# Patient Record
Sex: Female | Born: 1985 | Race: White | Hispanic: No | Smoking: Never smoker
Health system: Southern US, Community
[De-identification: ages and names within clinical notes are randomized; demographics above are authoritative.]

## PROBLEM LIST (undated history)

## (undated) DIAGNOSIS — K219 Gastro-esophageal reflux disease without esophagitis: Secondary | ICD-10-CM

## (undated) DIAGNOSIS — T7840XA Allergy, unspecified, initial encounter: Secondary | ICD-10-CM

## (undated) DIAGNOSIS — F419 Anxiety disorder, unspecified: Secondary | ICD-10-CM

## (undated) DIAGNOSIS — F32A Depression, unspecified: Secondary | ICD-10-CM

## (undated) DIAGNOSIS — K529 Noninfective gastroenteritis and colitis, unspecified: Secondary | ICD-10-CM

## (undated) DIAGNOSIS — G8929 Other chronic pain: Secondary | ICD-10-CM

## (undated) DIAGNOSIS — R87629 Unspecified abnormal cytological findings in specimens from vagina: Secondary | ICD-10-CM

## (undated) DIAGNOSIS — R519 Headache, unspecified: Secondary | ICD-10-CM

## (undated) DIAGNOSIS — M1990 Unspecified osteoarthritis, unspecified site: Secondary | ICD-10-CM

## (undated) DIAGNOSIS — K900 Celiac disease: Secondary | ICD-10-CM

## (undated) DIAGNOSIS — R51 Headache: Secondary | ICD-10-CM

## (undated) DIAGNOSIS — D649 Anemia, unspecified: Secondary | ICD-10-CM

## (undated) DIAGNOSIS — J45909 Unspecified asthma, uncomplicated: Secondary | ICD-10-CM

## (undated) HISTORY — DX: Celiac disease: K90.0

## (undated) HISTORY — DX: Headache, unspecified: R51.9

## (undated) HISTORY — DX: Unspecified abnormal cytological findings in specimens from vagina: R87.629

## (undated) HISTORY — DX: Unspecified osteoarthritis, unspecified site: M19.90

## (undated) HISTORY — DX: Other chronic pain: G89.29

## (undated) HISTORY — DX: Anemia, unspecified: D64.9

## (undated) HISTORY — DX: Anxiety disorder, unspecified: F41.9

## (undated) HISTORY — DX: Depression, unspecified: F32.A

## (undated) HISTORY — DX: Unspecified asthma, uncomplicated: J45.909

## (undated) HISTORY — DX: Gastro-esophageal reflux disease without esophagitis: K21.9

## (undated) HISTORY — DX: Allergy, unspecified, initial encounter: T78.40XA

## (undated) HISTORY — DX: Headache: R51

## (undated) HISTORY — DX: Noninfective gastroenteritis and colitis, unspecified: K52.9

## (undated) HISTORY — PX: ADENOIDECTOMY: SUR15

---
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
## 2003-04-06 ENCOUNTER — Encounter: Payer: Self-pay | Admitting: Family Medicine

## 2003-04-06 LAB — CONVERTED CEMR LAB

## 2004-02-19 ENCOUNTER — Ambulatory Visit: Payer: Self-pay | Admitting: Family Medicine

## 2004-04-09 ENCOUNTER — Ambulatory Visit: Payer: Self-pay | Admitting: Family Medicine

## 2004-04-13 ENCOUNTER — Ambulatory Visit: Payer: Self-pay | Admitting: Internal Medicine

## 2004-05-29 ENCOUNTER — Ambulatory Visit: Payer: Self-pay | Admitting: Family Medicine

## 2004-12-02 ENCOUNTER — Ambulatory Visit: Payer: Self-pay | Admitting: Family Medicine

## 2005-04-02 ENCOUNTER — Ambulatory Visit: Payer: Self-pay | Admitting: Family Medicine

## 2005-09-22 ENCOUNTER — Ambulatory Visit: Payer: Self-pay | Admitting: Family Medicine

## 2005-10-20 ENCOUNTER — Ambulatory Visit: Payer: Self-pay | Admitting: Gastroenterology

## 2005-11-29 ENCOUNTER — Ambulatory Visit: Payer: Self-pay | Admitting: Gastroenterology

## 2006-01-20 ENCOUNTER — Ambulatory Visit: Payer: Self-pay | Admitting: Family Medicine

## 2006-03-04 ENCOUNTER — Ambulatory Visit: Payer: Self-pay | Admitting: Family Medicine

## 2006-07-20 ENCOUNTER — Encounter: Payer: Self-pay | Admitting: Family Medicine

## 2006-07-20 DIAGNOSIS — K5900 Constipation, unspecified: Secondary | ICD-10-CM | POA: Insufficient documentation

## 2006-07-20 DIAGNOSIS — G4700 Insomnia, unspecified: Secondary | ICD-10-CM | POA: Insufficient documentation

## 2006-07-20 DIAGNOSIS — Z8619 Personal history of other infectious and parasitic diseases: Secondary | ICD-10-CM | POA: Insufficient documentation

## 2006-07-20 DIAGNOSIS — G43909 Migraine, unspecified, not intractable, without status migrainosus: Secondary | ICD-10-CM | POA: Insufficient documentation

## 2010-04-26 ENCOUNTER — Encounter: Payer: Self-pay | Admitting: Gastroenterology

## 2010-08-22 ENCOUNTER — Encounter: Payer: Self-pay | Admitting: Family Medicine

## 2010-08-25 ENCOUNTER — Ambulatory Visit: Payer: Self-pay | Admitting: Family Medicine

## 2010-08-25 DIAGNOSIS — Z0289 Encounter for other administrative examinations: Secondary | ICD-10-CM

## 2013-04-18 ENCOUNTER — Other Ambulatory Visit (HOSPITAL_COMMUNITY)
Admission: RE | Admit: 2013-04-18 | Discharge: 2013-04-18 | Disposition: A | Discharge status: Home / Self Care | Payer: BC Managed Care – PPO | Source: Ambulatory Visit | Attending: Family Medicine | Admitting: Family Medicine

## 2013-04-18 ENCOUNTER — Ambulatory Visit (INDEPENDENT_AMBULATORY_CARE_PROVIDER_SITE_OTHER): Payer: BC Managed Care – PPO | Admitting: Family Medicine

## 2013-04-18 ENCOUNTER — Encounter: Payer: Self-pay | Admitting: Family Medicine

## 2013-04-18 VITALS — BP 122/82 | HR 77 | Temp 98.6°F | Ht 67.0 in | Wt 153.5 lb

## 2013-04-18 DIAGNOSIS — R87810 Cervical high risk human papillomavirus (HPV) DNA test positive: Secondary | ICD-10-CM | POA: Insufficient documentation

## 2013-04-18 DIAGNOSIS — K900 Celiac disease: Secondary | ICD-10-CM

## 2013-04-18 DIAGNOSIS — Z124 Encounter for screening for malignant neoplasm of cervix: Secondary | ICD-10-CM | POA: Insufficient documentation

## 2013-04-18 DIAGNOSIS — Z0000 Encounter for general adult medical examination without abnormal findings: Secondary | ICD-10-CM

## 2013-04-18 DIAGNOSIS — Z1151 Encounter for screening for human papillomavirus (HPV): Secondary | ICD-10-CM | POA: Insufficient documentation

## 2013-04-18 DIAGNOSIS — Z01419 Encounter for gynecological examination (general) (routine) without abnormal findings: Secondary | ICD-10-CM

## 2013-04-18 MED ORDER — NORGESTIMATE-ETH ESTRADIOL 0.25-35 MG-MCG PO TABS: 1.0000 | ORAL_TABLET | Freq: Every day | ORAL | Status: DC

## 2013-04-18 MED ORDER — CYCLOBENZAPRINE HCL 10 MG PO TABS: 10.0000 mg | ORAL_TABLET | Freq: Three times a day (TID) | ORAL | Status: DC | PRN

## 2013-04-18 MED ORDER — PANTOPRAZOLE SODIUM 40 MG PO TBEC: 40.0000 mg | DELAYED_RELEASE_TABLET | Freq: Every day | ORAL | Status: DC

## 2013-04-18 MED ORDER — ALPRAZOLAM 0.25 MG PO TABS: 0.2500 mg | ORAL_TABLET | Freq: Two times a day (BID) | ORAL | Status: DC | PRN

## 2013-04-18 NOTE — Progress Notes (Signed)
Pre-visit discussion using our clinic review tool. No additional management support is needed unless otherwise documented below in the visit note.  

## 2013-04-18 NOTE — Progress Notes (Signed)
Subjective:    Patient ID: Brittany Copeland, female    DOB: 1986/01/10, 28 y.o.   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  Not a smoker   Not currently sexually active  Has had HPV in the past - with colp and bx -ok  Last paps are normal    Patient Active Problem List   Diagnosis Date Noted  . Routine general medical examination at a health care facility 04/18/2013  . Encounter for routine gynecological examination 04/18/2013  . 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 MIGRAINE HEADACHE 07/20/2006  . CONSTIPATION 07/20/2006  . INSOMNIA 07/20/2006   Past Medical History  Diagnosis Date  . Celiac disease    No past surgical history on file. History  Substance Use Topics  . Smoking status: Never Smoker   . Smokeless tobacco: Not on file  . Alcohol Use: Yes     Comment: socially   Family History  Problem Relation Age of Onset  . Heart disease Father    Allergies  Allergen Reactions  . Gluten Meal     Has celiac disease  .  Pineapple    No current outpatient prescriptions on file prior to visit.   No current facility-administered medications on file prior to visit.    Review of Systems Review of Systems  Constitutional: Negative for fever, appetite change, fatigue and unexpected weight change.  Eyes: Negative for pain and visual disturbance.  Respiratory: Negative for cough and shortness of breath.   Cardiovascular: Negative for cp or palpitations    Gastrointestinal: Negative for nausea, diarrhea and constipation.  Genitourinary: Negative for urgency and frequency.  Skin: Negative for pallor or rash   Neurological: Negative for weakness, light-headedness, numbness and headaches.  Hematological: Negative for adenopathy. Does not bruise/bleed easily.  Psychiatric/Behavioral: Negative for dysphoric mood. The patient is not nervous/anxious.         Objective:   Physical Exam  Constitutional: She appears well-developed and well-nourished. No distress.  HENT:  Head: Normocephalic and atraumatic.  Right Ear: External ear normal.  Left Ear: External ear normal.  Nose: Nose normal.  Mouth/Throat: Oropharynx is clear and moist.  Eyes: Conjunctivae and EOM are normal. Pupils are equal, round, and reactive to light. Right eye exhibits  no discharge. Left eye exhibits no discharge. No scleral icterus.  Neck: Normal range of motion. Neck supple. No JVD present. No thyromegaly present.  Cardiovascular: Normal rate, regular rhythm, normal heart sounds and intact distal pulses.  Exam reveals no gallop.   Pulmonary/Chest: Effort normal and breath sounds normal. No respiratory distress. She has no wheezes. She has no rales.  Abdominal: Soft. Bowel sounds are normal. She exhibits no distension and no mass. There is no tenderness.  Genitourinary: No breast swelling, tenderness, discharge or bleeding. There is no rash, tenderness or lesion on the right labia. There is no rash, tenderness or lesion on the left labia. Uterus  is not enlarged and not tender. Cervix exhibits no motion tenderness, no discharge and no friability. Right adnexum displays no mass, no tenderness and no fullness. Left adnexum displays no mass, no tenderness and no fullness. No erythema or tenderness around the vagina.  Scant spotting/ end of menses  Breast exam: No mass, nodules, thickening, tenderness, bulging, retraction, inflamation, nipple discharge or skin changes noted.  No axillary or clavicular LA.  Chaperoned exam.    Musculoskeletal: She exhibits no edema and no tenderness.  Lymphadenopathy:    She has no cervical adenopathy.  Neurological: She is alert. She has normal reflexes. No cranial nerve deficit. She exhibits normal muscle tone. Coordination normal.  Skin: Skin is warm and dry. No rash noted. No erythema. No pallor.  Psychiatric: She has a normal mood and affect.          Assessment & Plan:

## 2013-04-18 NOTE — Patient Instructions (Addendum)
Labs today  Start the ortho cyclen on Sunday and take as directed  Take care of yourself  I sent medicines to [facility] pharmacy next door

## 2013-04-19 DIAGNOSIS — K900 Celiac disease: Secondary | ICD-10-CM | POA: Insufficient documentation

## 2013-04-19 LAB — LIPID PANEL
CHOLESTEROL: 166 mg/dL (ref 0–200)
HDL: 71 mg/dL (ref >39.00)
LDL Cholesterol: 69 mg/dL (ref 0–99)
TRIGLYCERIDES: 130 mg/dL (ref 0.0–149.0)
Total CHOL/HDL Ratio: 2
VLDL: 26 mg/dL (ref 0.0–40.0)

## 2013-04-19 LAB — CBC WITH DIFFERENTIAL/PLATELET
BASOS PCT: 0.5 % (ref 0.0–3.0)
Basophils Absolute: 0 10*3/uL (ref 0.0–0.1)
EOS ABS: 0 10*3/uL (ref 0.0–0.7)
EOS PCT: 0.6 % (ref 0.0–5.0)
HEMATOCRIT: 37.2 % (ref 36.0–46.0)
Hemoglobin: 12.7 g/dL (ref 12.0–15.0)
LYMPHS ABS: 2.4 10*3/uL (ref 0.7–4.0)
Lymphocytes Relative: 33.2 % (ref 12.0–46.0)
MCHC: 34.2 g/dL (ref 30.0–36.0)
MCV: 87.6 fl (ref 78.0–100.0)
MONO ABS: 0.3 10*3/uL (ref 0.1–1.0)
Monocytes Relative: 4.2 % (ref 3.0–12.0)
Neutro Abs: 4.3 10*3/uL (ref 1.4–7.7)
Neutrophils Relative %: 61.5 % (ref 43.0–77.0)
PLATELETS: 295 10*3/uL (ref 150.0–400.0)
RBC: 4.25 Mil/uL (ref 3.87–5.11)
RDW: 12.6 % (ref 11.5–14.6)
WBC: 7.1 10*3/uL (ref 4.5–10.5)

## 2013-04-19 LAB — COMPREHENSIVE METABOLIC PANEL
ALK PHOS: 43 U/L (ref 39–117)
ALT: 19 U/L (ref 0–35)
AST: 19 U/L (ref 0–37)
Albumin: 4.5 g/dL (ref 3.5–5.2)
BILIRUBIN TOTAL: 0.4 mg/dL (ref 0.3–1.2)
BUN: 12 mg/dL (ref 6–23)
CO2: 27 mEq/L (ref 19–32)
Calcium: 9.8 mg/dL (ref 8.4–10.5)
Chloride: 103 mEq/L (ref 96–112)
Creatinine, Ser: 0.6 mg/dL (ref 0.4–1.2)
GFR: 127.66 mL/min (ref >60.00)
GLUCOSE: 94 mg/dL (ref 70–99)
Potassium: 4.1 mEq/L (ref 3.5–5.1)
Sodium: 137 mEq/L (ref 135–145)
Total Protein: 7.5 g/dL (ref 6.0–8.3)

## 2013-04-19 LAB — TSH: TSH: 0.38 u[IU]/mL (ref 0.35–5.50)

## 2013-04-19 NOTE — Assessment & Plan Note (Signed)
Annual exam with pap  Remote hx of HPV Pt declines std testing  OC- ortho cyclen to start Long discussion re: way to take OC properly and avoidance of smoking  Risks of blood clots outlined as well as possible side eff Pt aware that this does not prevent stds and condoms should still be used inst that it may take up to 3 months for menses to fall into rhythm or side eff to stop  Adv to call if problems or questions

## 2013-04-19 NOTE — Assessment & Plan Note (Signed)
Reviewed health habits including diet and exercise and skin cancer prevention Reviewed appropriate screening tests for age  Also reviewed health mt list, fam hx and immunization status , as well as social and family history   See HPI Wellness lab today

## 2013-04-24 ENCOUNTER — Telehealth: Payer: Self-pay | Admitting: *Deleted

## 2013-04-24 NOTE — Telephone Encounter (Signed)
aware

## 2013-04-24 NOTE — Telephone Encounter (Signed)
Brittany Copeland with [facility] called in a call report, pt has abnormal pap smear (pos HPV), [facility] faxed over copy of results because it may be a day or so before the final results are in [facility], received fax and placed in your inbox

## 2013-04-25 ENCOUNTER — Telehealth: Payer: Self-pay | Admitting: Family Medicine

## 2013-04-25 DIAGNOSIS — IMO0002 Reserved for concepts with insufficient information to code with codable children: Secondary | ICD-10-CM | POA: Insufficient documentation

## 2013-04-25 NOTE — Telephone Encounter (Signed)
Abn pap with hpv Ref to gyn

## 2013-05-01 ENCOUNTER — Encounter: Payer: BC Managed Care – PPO | Admitting: Obstetrics and Gynecology

## 2013-05-08 ENCOUNTER — Ambulatory Visit (INDEPENDENT_AMBULATORY_CARE_PROVIDER_SITE_OTHER): Payer: BC Managed Care – PPO | Admitting: Family Medicine

## 2013-05-08 ENCOUNTER — Encounter: Payer: Self-pay | Admitting: Family Medicine

## 2013-05-08 VITALS — BP 122/78 | HR 84 | Temp 98.9°F | Wt 156.5 lb

## 2013-05-08 DIAGNOSIS — J0100 Acute maxillary sinusitis, unspecified: Secondary | ICD-10-CM

## 2013-05-08 MED ORDER — AMOXICILLIN-POT CLAVULANATE 875-125 MG PO TABS
1.0000 | ORAL_TABLET | Freq: Two times a day (BID) | ORAL | Status: DC
Start: 2013-05-08 — End: 2013-05-11

## 2013-05-08 NOTE — Assessment & Plan Note (Signed)
Anticipate viral given short duration, however provided with WASP for augmentin to fill in case not improving as expected or any worsening prior. See pt instructions for supportive care in interim. Pt agrees with plan.

## 2013-05-08 NOTE — Progress Notes (Signed)
Pre-visit discussion using our clinic review tool. No additional management support is needed unless otherwise documented below in the visit note.  

## 2013-05-08 NOTE — Progress Notes (Signed)
   Subjective:    Patient ID: Brittany Copeland, female    DOB: [birth year], 28 y.o.   MRN: [id number]  HPI CC: LAD?  Pleasant 28 yo with h/o celiac disease presents with 4-5 day history of R ear pain and neck glands swollen, PNdrainage.  Now with sneezing, mild dry cough, right maxillary facial pain/pressure and frontal pain as well.  Chills this morning.  Possible fever to 101 this morning (?digital thermometer).  So far has tried gluten free cough syrup by nature's way.  No abd pain, tooth pain, rashes.  No dyspnea or wheezing.  No sick contacts at home. Did get flu shot this year. No smokers at home. No h/o asthma.  Past Medical History  Diagnosis Date  . Celiac disease      Review of Systems Per HPi    Objective:   Physical Exam  Nursing note and vitals reviewed. Constitutional: She appears well-developed and well-nourished. No distress.  HENT:  Head: Normocephalic and atraumatic.  Right Ear: Hearing, tympanic membrane, external ear and ear canal normal.  Left Ear: Hearing, tympanic membrane, external ear and ear canal normal.  Nose: Mucosal edema present. No rhinorrhea. Right sinus exhibits maxillary sinus tenderness. Right sinus exhibits no frontal sinus tenderness. Left sinus exhibits no maxillary sinus tenderness and no frontal sinus tenderness.  Mouth/Throat: Uvula is midline, oropharynx is clear and moist and mucous membranes are normal. No oropharyngeal exudate, posterior oropharyngeal edema, posterior oropharyngeal erythema or tonsillar abscesses.  Nasal mucosal inflammation/erythema  Eyes: Conjunctivae and EOM are normal. Pupils are equal, round, and reactive to light. No scleral icterus.  Neck: Normal range of motion. Neck supple. No thyromegaly present.  Cardiovascular: Normal rate, regular rhythm, normal heart sounds and intact distal pulses.   No murmur heard. Pulmonary/Chest: Effort normal and breath sounds normal. No respiratory distress. She has no  wheezes. She has no rales.  Lymphadenopathy:    She has cervical adenopathy (tender R AC LAD).  Skin: Skin is warm and dry. No rash noted.       Assessment & Plan:

## 2013-05-08 NOTE — Patient Instructions (Signed)
You have an acute sinus infection. Start ibuprofen 400-600mg  twice daily with food. Push fluids and plenty of rest. Check with pharmacist about plain mucinex use with celiac disease - with plenty of water to help mobilize mucous. Nasal saline irrigation or neti pot to help drain sinuses. If persistent symptoms or if any worsening fill antibiotic (augmentin 10 d course).

## 2013-05-11 ENCOUNTER — Telehealth: Payer: Self-pay

## 2013-05-11 MED ORDER — DOXYCYCLINE HYCLATE 100 MG PO CAPS: 100.0000 mg | ORAL_CAPSULE | Freq: Two times a day (BID) | ORAL | Status: DC

## 2013-05-11 NOTE — Telephone Encounter (Signed)
Patient notified

## 2013-05-11 NOTE — Telephone Encounter (Signed)
Let's stop augmentin, may try doxycycline for sinus infection.  Sent in. Remember to use second form of birth control while on antibiotics

## 2013-05-11 NOTE — Telephone Encounter (Signed)
Pt left v/m; pt was seen 05/08/13 and pt got augmentin filled on 05/10/13 and began to take on 05/10/13, pt started to vomit after taking second dose of Augmentin, pt has vomited x 3 since last night; last time pt vomited was 3AM. Pt took Augmentin with food but still had vomiting. Pt has not eaten since last night and pt is able to drink fluids this AM. Pt wants to know if should stop Augmentin and have different antibiotic to [facility]. Pt request cb.

## 2013-06-06 ENCOUNTER — Encounter: Payer: BC Managed Care – PPO | Admitting: Family Medicine

## 2013-07-05 ENCOUNTER — Encounter: Payer: BC Managed Care – PPO | Admitting: Obstetrics & Gynecology

## 2013-09-11 ENCOUNTER — Encounter: Payer: Self-pay | Admitting: Family Medicine

## 2013-09-11 ENCOUNTER — Ambulatory Visit (INDEPENDENT_AMBULATORY_CARE_PROVIDER_SITE_OTHER): Payer: BC Managed Care – PPO | Admitting: Family Medicine

## 2013-09-11 ENCOUNTER — Ambulatory Visit (INDEPENDENT_AMBULATORY_CARE_PROVIDER_SITE_OTHER)
Admission: RE | Admit: 2013-09-11 | Discharge: 2013-09-11 | Disposition: A | Discharge status: Home / Self Care | Payer: BC Managed Care – PPO | Source: Ambulatory Visit | Attending: Family Medicine | Admitting: Family Medicine

## 2013-09-11 VITALS — BP 112/70 | HR 80 | Temp 98.5°F | Ht 67.0 in | Wt 150.8 lb

## 2013-09-11 DIAGNOSIS — M2240 Chondromalacia patellae, unspecified knee: Secondary | ICD-10-CM

## 2013-09-11 DIAGNOSIS — M25561 Pain in right knee: Secondary | ICD-10-CM

## 2013-09-11 DIAGNOSIS — M94261 Chondromalacia, right knee: Secondary | ICD-10-CM

## 2013-09-11 DIAGNOSIS — M25569 Pain in unspecified knee: Secondary | ICD-10-CM

## 2013-09-11 NOTE — Progress Notes (Signed)
Pre visit review using our clinic review tool, if applicable. No additional management support is needed unless otherwise documented below in the visit note. 

## 2013-09-11 NOTE — Progress Notes (Signed)
Subjective:    Patient ID: Brittany Copeland, female    DOB: [birth year], 28 y.o.   MRN: [id number]  HPI Here with R knee pain  It started hurt when she was working out -- she does Archivist and squats routine  3 weeks   Pain seems under patella and medial to it  At times is very painful (sunday night she could not get to sleep) at other times just a dull ache Worse with squatting and stairs  Lots of grinding and popping (both knees)   No hx of injury   It was a little swollen the other day   Patient Active Problem List   Diagnosis Date Noted  . Acute maxillary sinusitis 05/08/2013  . Abnormal cervical Pap smear with positive HPV DNA test 04/25/2013  . Celiac disease 04/19/2013  . Routine general medical examination at a health care facility 04/18/2013  . Encounter for routine gynecological examination 04/18/2013  . 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 MIGRAINE HEADACHE 07/20/2006   Past Medical History  Diagnosis Date  . Celiac disease    No past surgical history on file. History  Substance Use Topics  . Smoking status: Never Smoker   . Smokeless tobacco: Not on file  . Alcohol Use: Yes     Comment: socially   Family History  Problem Relation Age of Onset  . Heart disease Father    Allergies  Allergen Reactions  . Augmentin [Amoxicillin-Pot Clavulanate] Nausea And Vomiting  . Gluten Meal     Has celiac disease  . Pineapple    Current Outpatient Prescriptions on File Prior to Visit  Medication Sig Dispense Refill  . ALPRAZolam (XANAX) 0.25 MG tablet Take 1 tablet (0.25 mg total) by mouth 2 (two) times daily as needed for anxiety.  30 tablet  1  . cyclobenzaprine (FLEXERIL) 10 MG tablet Take 1 tablet (10 mg total) by mouth 3 (three) times daily as needed (migraines).  30 tablet  1  . pantoprazole (PROTONIX) 40 MG tablet Take 1 tablet (40 mg total) by mouth daily.  30 tablet  11   No current facility-administered medications on file prior to visit.      Review of Systems Review of Systems  Constitutional: Negative for fever, appetite change, fatigue and unexpected weight change.  Eyes: Negative for pain and visual disturbance.  Respiratory: Negative for cough and shortness of breath.   Cardiovascular: Negative for cp or palpitations    Gastrointestinal: Negative for nausea, diarrhea and constipation.  Genitourinary: Negative for urgency and frequency.  Skin: Negative for pallor or rash   MSK pos for knee pain / neg for other joint pain or changes  Neurological: Negative for weakness, light-headedness, numbness and headaches.  Hematological: Negative for adenopathy. Does not bruise/bleed easily.  Psychiatric/Behavioral: Negative for dysphoric mood. The patient is not nervous/anxious.         Objective:   Physical Exam  Constitutional: She appears well-developed and well-nourished. No distress.  Cardiovascular: Normal rate and regular rhythm.   Musculoskeletal: She exhibits tenderness. She exhibits no edema.       Right knee: She exhibits decreased range of motion. She exhibits no swelling, no effusion, no erythema, normal alignment, no LCL laxity, no bony tenderness, normal meniscus and no MCL laxity. Tenderness found. Medial joint line and patellar tendon tenderness noted.  R knee: Mild crepitus  Pain to flex over 90 deg/nl ext /neg bounce test Stable on lachman and ant drawer  Neg mcmurray Some patellar  grind  Tender over patellar tendon and medial patellar border  Neurological: She is alert. She has normal reflexes. No cranial nerve deficit. She exhibits normal muscle tone. Coordination normal.  Skin: Skin is warm and dry. No rash noted. No erythema.  Psychiatric: She has a normal mood and affect.          Assessment & Plan:   Problem List Items Addressed This Visit     Musculoskeletal and Integument   Chondromalacia of right knee     Suspect this is source of pain  Xray knee today Ibuprofen tid prn with  food/elevate/ice/relative rest this week  May need sport med/strengthening exercises Adv a patellar sleeve or knee band for activity Update further after rad rev of xr    Relevant Orders      DG Knee Complete 4 Views Right (Completed)     Other   Right knee pain - Primary   Relevant Orders      DG Knee Complete 4 Views Right (Completed)

## 2013-09-11 NOTE — Assessment & Plan Note (Signed)
Suspect this is source of pain  Xray knee today Ibuprofen tid prn with food/elevate/ice/relative rest this week  May need sport med/strengthening exercises Adv a patellar sleeve or knee band for activity Update further after rad rev of xr

## 2013-09-11 NOTE — Patient Instructions (Signed)
I think you have a patellar tracking problem in the right knee  Rest it this week Elevate and ice it whenever you can  Get a pull on knee sleeve with patellar hole or a knee band that fits under the patella and wear for activity Ibuprofen 400-800 mg with a meal up to three times daily is ok  Xray now and then we will make a plan

## 2013-09-14 ENCOUNTER — Telehealth: Payer: Self-pay

## 2013-09-14 NOTE — Telephone Encounter (Signed)
Pt left v/m requesting knee xray report called to [phone].

## 2013-09-14 NOTE — Telephone Encounter (Signed)
Results released on Mychart, I advise pt over phone of Dr. Marliss Coots comments/ recommendations

## 2013-10-19 ENCOUNTER — Telehealth: Payer: Self-pay | Admitting: Family Medicine

## 2013-10-19 MED ORDER — BACITRA-NEOMYCIN-POLYMYXIN-HC 1 % OP OINT: TOPICAL_OINTMENT | OPHTHALMIC | Status: DC

## 2013-10-19 NOTE — Telephone Encounter (Signed)
I looked at the eye drops -and since it is a homeopathic product I cannot get a good handle on what the gluten content is - there are several herbal ingredients as well as sulfur  The best thing for a stye is warm compresses as often as possible and keeping it clean If it is very large/ draining -it is best to see a doctor (or if fever etc) (although drainage may also help it go down) Otherwise we may try  an opthalmologic ointment - we usually use it in the eye (so it is safe it it gets in the eye)- but try to put it on the stye area twice daily  If worse -please go to Urgent care over the weekend for immediate attention-especially if more redness/swelling or any fever or vision change   Please call in px

## 2013-10-19 NOTE — Telephone Encounter (Signed)
Pt notified of Dr. Marliss Coots comments/recommendations. Pt did want to try ointment. Rx sent to pharmacy and I advised pt if sxs worsen or don't improve to go to UC or f/u with our office next week, pt verbalized understanding

## 2013-10-19 NOTE — Telephone Encounter (Signed)
Patient Information:  Caller Name: Kiva  Phone: [phone]  Patient: Brittany Copeland, Brittany Copeland  Gender: Female  DOB: 1985/05/27  Age: 28 Years  PCP: Loura Pardon [facility])  Pregnant: No  Office Follow Up:  Does the office need to follow up with this patient?: Yes  Instructions For The Office: Patient requests Rx to treat her sty on right upper eyelid.   Symptoms  Reason For Call & Symptoms: Patient calling about sty on upper right eye lid.  She also reports she feels that she has had celiac reaction to Similasan eye drops.  She reports white thick / gooey drainage from a small spot near the outer area of the lid.  Reports moderate eye pain that interferes with activity.  Go to Office Now per Eye - Pus or Discharge guideline due to Moderate eye pain.  Reviewed Health History In EMR: Yes  Reviewed Medications In EMR: Yes  Reviewed Allergies In EMR: Yes  Reviewed Surgeries / Procedures: Yes  Date of Onset of Symptoms: 10/12/2013  Treatments Tried: warm compresses, eye drops  Treatments Tried Worked: No OB / GYN:  LMP: 10/15/2013  Guideline(s) Used:  Eye - Pus or Discharge  Disposition Per Guideline:   Go to Office Now  Reason For Disposition Reached:   Moderate eye pain (e.g., interferes with normal activities)  Advice Given:  Eyelid Cleansing:   Gently wash eyelids and lashes with warm water and wet cotton balls (or cotton gauze). Remove all the dried and liquid pus.  Do this as often as needed.  Eyelid Cleansing:   Gently wash eyelids and lashes with warm water and wet cotton balls (or cotton gauze). Remove all the dried and liquid pus.  Do this as often as needed.  Contact Lenses:  Switch to glasses for a short time. This will help stop damage to your eye.  Contagiousness:  Try not to touch your eyes. Wash your hands often. Do not share towels. Try not to touch your eyes. Wash your hands frequently. Do not share towels.  You may return to work or school. Avoid  physical contact (e.g., shaking hands) until the symptoms have resolved.  Call Back If  Eyelid becomes red or swollen  You become worse.  Patient Refused Recommendation:  Patient Refused Care Advice  Patient reports she is unsure if she could make it to office due to diarrhea and vomiting from what she feels is reaction to medication.  She asks for Rx to treat the sty.  Confirmed requested pharmacy.

## 2013-10-19 NOTE — Telephone Encounter (Signed)
See note

## 2014-01-18 ENCOUNTER — Other Ambulatory Visit: Payer: Self-pay

## 2014-02-06 ENCOUNTER — Ambulatory Visit (INDEPENDENT_AMBULATORY_CARE_PROVIDER_SITE_OTHER): Payer: BC Managed Care – PPO | Admitting: Family Medicine

## 2014-02-06 ENCOUNTER — Encounter: Payer: Self-pay | Admitting: Family Medicine

## 2014-02-06 VITALS — BP 120/70 | HR 75 | Temp 98.6°F | Ht 67.0 in | Wt 156.8 lb

## 2014-02-06 DIAGNOSIS — M25561 Pain in right knee: Secondary | ICD-10-CM

## 2014-02-06 NOTE — Progress Notes (Signed)
Dr. Frederico Hamman T. Lurie Mullane, MD, [facility] [place], [address] Phone: [phone] Fax: [phone]  02/06/2014  Patient: Brittany Copeland, MRN: [id number], DOB: 1985/12/06, 28 y.o.  Primary Physician:  Loura Pardon, MD  Chief Complaint: Knee Pain  Subjective:   Brittany Copeland is a 28 y.o. very pleasant female patient who presents with the following:  consulting physician:Dr. Glori Bickers Reason for consult: Ongoing right knee pain  Very pleasant young woman with celiac disease who presents with right-sided knee pain that has been ongoing since approximately May or June 2015.  She does not recall any specific occult injury.  She has been having some pain intermittently in the back, and she also does have some pain medially.  She has variously tried different anti-inflammatories, and she is altered her workout routine.  Most recently, she has been doing some Barre classes, and these usually feel okay, but occasionally she will have some pain medially and anteriorly.  She has not been having any significant swelling at all recently.  She has not had any locking up of the joint and no symptomatic giving way.  No known injury. Old busted L knee.   The radiological images were independently reviewed by myself in the office and results were reviewed with the patient. My independent interpretation of images:  I reviewed the patient's 4 view knee series myself personally.  The patient denied reviewed all of her films together.  She has been entirely unremarkable knee film.  There is no evidence of any fracture, dislocation, and she has no evidence of osteoarthritic change.  Her patella is located normally on the sunrise view.Electronically Signed  By: Owens Loffler, MD On: 02/07/2014 11:53 AM   Past Medical History, Surgical History, Social History, Family History, Problem List, Medications, and Allergies have been reviewed and updated if  relevant.  GEN: No fevers, chills. Nontoxic. Primarily MSK c/o today. MSK: Detailed in the HPI GI: tolerating PO intake without difficulty Neuro: No numbness, parasthesias, or tingling associated. Otherwise the pertinent positives of the ROS are noted above.   Objective:   BP 120/70 mmHg  Pulse 75  Temp(Src) 98.6 F (37 C) (Oral)  Ht 5\' 7"  (1.702 m)  Wt 156 lb 12 oz (71.101 kg)  BMI 24.54 kg/m2  LMP 02/02/2014   GEN: WDWN, NAD, Non-toxic, Alert & Oriented x 3 HEENT: Atraumatic, Normocephalic.  Ears and Nose: No external deformity. EXTR: No clubbing/cyanosis/edema NEURO: Normal gait.  PSYCH: Normally interactive. Conversant. Not depressed or anxious appearing.  Calm demeanor.   Knee:  R Gait: Normal heel toe pattern ROM: 0-140 Effusion: neg Echymosis or edema: none Patellar tendon NT Painful PLICA: mild medially Patellar grind: negative Medial and lateral patellar facet loading: negative medial and lateral joint lines:NT Mcmurray's neg Flexion-pinch neg Varus and valgus stress: stable Lachman: neg Ant and Post drawer: neg Hip abduction, IR, ER: WNL Hip flexion str: 5/5 Hip abd: 5/5 Quad: 5/5 VMO atrophy:No Hamstring concentric and eccentric: 5/5   Radiology: RIGHT KNEE - COMPLETE 4+ VIEW  COMPARISON: None.  FINDINGS: No evidence of acute fracture or dislocation. Well-preserved joint spaces. Well-preserved bone mineral density. No intrinsic osseous abnormality. No visible joint effusion. No evidence of a tracking abnormality on the patellar sunrise view.  IMPRESSION: Normal examination.   Electronically Signed  By: Evangeline Dakin M.D.  On: 09/11/2013 16:02  Assessment and Plan:   Right knee pain  >25 minutes spent in face to face time with patient, >  50% spent in counselling or coordination of care:  I am very reassured by the patient's plain films and her examination.  She has effectively no patellofemoral symptoms.  Her range of motion  is exceptionally good.  All ligaments are stable.  Her symptoms may be coming from a medial plical band which is very difficult to palpate.  I do not think it would be possible to inject this.  It is possible she could have had a small medial meniscal tear.  Given all this, we discussed options, and I think she is perfectly safe to get back to spin classes, and routine exercise which is her primary goal.  If she has worsening symptoms after doing such, then further investigation could be appropriate.  Follow-up: No Follow-up on file.  New Prescriptions   No medications on file   No orders of the defined types were placed in this encounter.    Signed,  Maud Deed. Oney Tatlock, MD   Patient's Medications  New Prescriptions   No medications on file  Previous Medications   ALPRAZOLAM (XANAX) 0.25 MG TABLET    Take 1 tablet (0.25 mg total) by mouth 2 (two) times daily as needed for anxiety.   CYCLOBENZAPRINE (FLEXERIL) 10 MG TABLET    Take 1 tablet (10 mg total) by mouth 3 (three) times daily as needed (migraines).   PANTOPRAZOLE (PROTONIX) 40 MG TABLET    Take 1 tablet (40 mg total) by mouth daily.  Modified Medications   No medications on file  Discontinued Medications   BACITRACIN-NEOMYCIN-POLYMYXIN-HYDROCORTISONE (CORTISPORIN) 1 % OPHTHALMIC OINTMENT    Apply a small amount to stye twice daily

## 2014-02-06 NOTE — Progress Notes (Signed)
Pre visit review using our clinic review tool, if applicable. No additional management support is needed unless otherwise documented below in the visit note. 

## 2014-02-26 ENCOUNTER — Ambulatory Visit (INDEPENDENT_AMBULATORY_CARE_PROVIDER_SITE_OTHER): Payer: BC Managed Care – PPO | Admitting: Family Medicine

## 2014-02-26 ENCOUNTER — Encounter: Payer: Self-pay | Admitting: Family Medicine

## 2014-02-26 VITALS — BP 116/84 | HR 80 | Temp 98.8°F | Ht 67.0 in | Wt 153.8 lb

## 2014-02-26 DIAGNOSIS — J029 Acute pharyngitis, unspecified: Secondary | ICD-10-CM

## 2014-02-26 DIAGNOSIS — J069 Acute upper respiratory infection, unspecified: Secondary | ICD-10-CM

## 2014-02-26 DIAGNOSIS — B9789 Other viral agents as the cause of diseases classified elsewhere: Principal | ICD-10-CM

## 2014-02-26 LAB — POCT RAPID STREP A (OFFICE): Rapid Strep A Screen: NEGATIVE

## 2014-02-26 MED ORDER — AZITHROMYCIN 250 MG PO TABS: ORAL_TABLET | ORAL | Status: DC

## 2014-02-26 NOTE — Assessment & Plan Note (Signed)
Disc symptomatic care - see instructions on AVS Will watch out for symptoms of sinusitis If worse over the weekend will fill px for zpak and update   Update if not starting to improve in a week or if worsening

## 2014-02-26 NOTE — Patient Instructions (Signed)
I think you have a viral upper respiratory infection  Drink lots of fluids and rest  Use your gluten free cough medicine  Ibuprofen if needed for fever or pain  claritin redi tabs for runny nose if needed  If worse-especially sinus pain - go ahead and fill the zpack and take as directed   Update if not starting to improve in a week or if worsening

## 2014-02-26 NOTE — Progress Notes (Signed)
Pre visit review using our clinic review tool, if applicable. No additional management support is needed unless otherwise documented below in the visit note. 

## 2014-02-26 NOTE — Progress Notes (Signed)
Subjective:    Patient ID: Brittany Copeland, female    DOB: 27-Mar-1986, 28 y.o.   MRN: [id number]  HPI Here with uri symptoms   Started about 10 d ago  ST last week- got worse at the end of the week and then better (ibuprofen helped)  Now has cold symptoms - congestion  Swollen glands  Feels horrible in general  Chills and aches over the weekend   No n/v/d   Wiped out   Taking "natures way gluten free cough syrup"  Cough is dry but nose is runny   Patient Active Problem List   Diagnosis Date Noted  . Viral URI with cough 02/26/2014  . Right knee pain 09/11/2013  . 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 Acute maxillary sinusitis 05/08/2013  . Abnormal cervical Pap smear with positive HPV DNA test 04/25/2013  . Celiac disease 04/19/2013  . Routine general medical examination at a health care facility 04/18/2013  . Encounter for routine gynecological examination 04/18/2013  . 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 MIGRAINE HEADACHE 07/20/2006   Past Medical History  Diagnosis Date  . Celiac disease    No past surgical history on file. History  Substance Use Topics  . Smoking status: Never Smoker   . Smokeless tobacco: Never Used  . Alcohol Use: 0.0 oz/week    0 Not specified per week     Comment: socially   Family History  Problem Relation Age of Onset  . Heart disease Father    Allergies  Allergen Reactions  . Augmentin [Amoxicillin-Pot Clavulanate] Nausea And Vomiting  . Gluten Meal     Has celiac disease  . Pineapple    Current Outpatient Prescriptions on File Prior to Visit  Medication Sig Dispense Refill  . ALPRAZolam (XANAX) 0.25 MG tablet Take 1 tablet (0.25 mg total) by mouth 2 (two) times daily as needed for anxiety. 30 tablet 1  . cyclobenzaprine (FLEXERIL) 10 MG tablet Take 1 tablet (10 mg total) by mouth 3 (three) times daily as needed (migraines). 30 tablet 1  . pantoprazole (PROTONIX) 40 MG tablet Take 1 tablet (40 mg total) by mouth  daily. 30 tablet 11   No current facility-administered medications on file prior to visit.    Review of Systems Review of Systems  Constitutional: Negative for fever, appetite change, and unexpected weight change.  ENT pos for cong and rhinorrhea and ST Eyes: Negative for pain and visual disturbance.  Respiratory: Negative for wheeze  and shortness of breath.   Cardiovascular: Negative for cp or palpitations    Gastrointestinal: Negative for nausea, diarrhea and constipation.  Genitourinary: Negative for urgency and frequency.  Skin: Negative for pallor or rash   Neurological: Negative for weakness, light-headedness, numbness and headaches.  Hematological: Negative for adenopathy. Does not bruise/bleed easily.  Psychiatric/Behavioral: Negative for dysphoric mood. The patient is not nervous/anxious.         Objective:   Physical Exam  Constitutional: She appears well-developed and well-nourished. No distress.  HENT:  Head: Normocephalic and atraumatic.  Right Ear: External ear normal.  Left Ear: External ear normal.  Mouth/Throat: Oropharynx is clear and moist. No oropharyngeal exudate.  Nares are injected and congested   No sinus tenderness  Throat clear Clear rhinorrhea   Eyes: Conjunctivae and EOM are normal. Pupils are equal, round, and reactive to light. Right eye exhibits no discharge. Left eye exhibits no discharge. No scleral icterus.  Neck: Normal range of motion. Neck supple.  Cardiovascular: Normal  rate, regular rhythm, normal heart sounds and intact distal pulses.   Pulmonary/Chest: Effort normal and breath sounds normal. No respiratory distress. She has no wheezes. She has no rales.  No rales or rhonchi  Lymphadenopathy:    She has no cervical adenopathy.  Skin: Skin is warm and dry. No rash noted.  Psychiatric: She has a normal mood and affect.          Assessment & Plan:   Problem List Items Addressed This Visit      Respiratory   Viral URI with cough     Disc symptomatic care - see instructions on AVS Will watch out for symptoms of sinusitis If worse over the weekend will fill px for zpak and update   Update if not starting to improve in a week or if worsening      Relevant Medications      azithromycin (ZITHROMAX) tablet    Other Visit Diagnoses    Sore throat    -  Primary    Relevant Orders       Rapid Strep A (Completed)

## 2014-06-19 ENCOUNTER — Encounter: Payer: Self-pay | Admitting: Family Medicine

## 2014-06-19 ENCOUNTER — Ambulatory Visit (INDEPENDENT_AMBULATORY_CARE_PROVIDER_SITE_OTHER): Payer: 59 | Admitting: Family Medicine

## 2014-06-19 VITALS — BP 128/88 | HR 96 | Temp 98.5°F | Ht 67.0 in | Wt 157.4 lb

## 2014-06-19 DIAGNOSIS — M25561 Pain in right knee: Secondary | ICD-10-CM

## 2014-06-19 DIAGNOSIS — I781 Nevus, non-neoplastic: Secondary | ICD-10-CM | POA: Insufficient documentation

## 2014-06-19 MED ORDER — CYCLOBENZAPRINE HCL 10 MG PO TABS: 10.0000 mg | ORAL_TABLET | Freq: Three times a day (TID) | ORAL | Status: DC | PRN

## 2014-06-19 MED ORDER — PANTOPRAZOLE SODIUM 40 MG PO TBEC: 40.0000 mg | DELAYED_RELEASE_TABLET | Freq: Every day | ORAL | Status: DC

## 2014-06-19 MED ORDER — ALPRAZOLAM 0.25 MG PO TABS: 0.2500 mg | ORAL_TABLET | Freq: Two times a day (BID) | ORAL | Status: DC | PRN

## 2014-06-19 NOTE — Progress Notes (Signed)
Subjective:    Patient ID: Brittany Copeland, female    DOB: 1985-10-31, 29 y.o.   MRN: [id number]  HPI Here for several issues   Small bump on L arm - like a mole  There for a month  Has changed-- getting bigger  Was itchy   R knee- seen 2 times for this  Dr Lorelei Pont dx her with poss medial meniscal tear  She went back to exercise  Pain off and on since then  "went out on her" twice -painful and buckled - was in barre class-almost fell  Pivot or rotation hurts the worst  Hard to do it all with the brace on  Last night-it was quite swollen Put ice on it  Ibuprofen helps a bit   Patient Active Problem List   Diagnosis Date Noted  . Viral URI with cough 02/26/2014  . Right knee pain 09/11/2013  . 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 Acute maxillary sinusitis 05/08/2013  . Abnormal cervical Pap smear with positive HPV DNA test 04/25/2013  . Celiac disease 04/19/2013  . Routine general medical examination at a health care facility 04/18/2013  . Encounter for routine gynecological examination 04/18/2013  . 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 MIGRAINE HEADACHE 07/20/2006   Past Medical History  Diagnosis Date  . Celiac disease    No past surgical history on file. History  Substance Use Topics  . Smoking status: Never Smoker   . Smokeless tobacco: Never Used  . Alcohol Use: 0.0 oz/week    0 Standard drinks or equivalent per week     Comment: socially   Family History  Problem Relation Age of Onset  . Heart disease Father    Allergies  Allergen Reactions  . Augmentin [Amoxicillin-Pot Clavulanate] Nausea And Vomiting  . Gluten Meal     Has celiac disease  . Pineapple    Current Outpatient Prescriptions on File Prior to Visit  Medication Sig Dispense Refill  . ALPRAZolam (XANAX) 0.25 MG tablet Take 1 tablet (0.25 mg total) by mouth 2 (two) times daily as needed for anxiety. 30 tablet 1  . cyclobenzaprine (FLEXERIL) 10 MG tablet Take 1 tablet (10 mg  total) by mouth 3 (three) times daily as needed (migraines). 30 tablet 1  . pantoprazole (PROTONIX) 40 MG tablet Take 1 tablet (40 mg total) by mouth daily. 30 tablet 11   No current facility-administered medications on file prior to visit.     Review of Systems Review of Systems  Constitutional: Negative for fever, appetite change, fatigue and unexpected weight change.  Eyes: Negative for pain and visual disturbance.  Respiratory: Negative for cough and shortness of breath.   Cardiovascular: Negative for cp or palpitations    Gastrointestinal: Negative for nausea, diarrhea and constipation.  Genitourinary: Negative for urgency and frequency.  Skin: Negative for pallor or rash  pos for mole on arm  MSK pos for R knee pain  Neurological: Negative for weakness, light-headedness, numbness and headaches.  Hematological: Negative for adenopathy. Does not bruise/bleed easily.  Psychiatric/Behavioral: Negative for dysphoric mood. The patient is not nervous/anxious.         Objective:   Physical Exam  Constitutional: She appears well-developed and well-nourished. No distress.  HENT:  Head: Normocephalic and atraumatic.  Mouth/Throat: Oropharynx is clear and moist.  Eyes: Conjunctivae and EOM are normal. Pupils are equal, round, and reactive to light.  Neck: Normal range of motion. Neck supple.  Cardiovascular: Regular rhythm and normal heart sounds.  Pulmonary/Chest: Effort normal and breath sounds normal. No respiratory distress. She has no wheezes. She has no rales.  Musculoskeletal: She exhibits tenderness. She exhibits no edema.       Right knee: She exhibits decreased range of motion and swelling. She exhibits no effusion, no ecchymosis, no deformity, no erythema, normal alignment, no LCL laxity, normal patellar mobility and no bony tenderness.  Tenderness in posterior knee without swelling  Some soft tissue swelling anterior knee  No effusion No joint line tenderness Pain on  full extension of knee  Nl flexion  No instability  Lymphadenopathy:    She has no cervical adenopathy.  Neurological: She is alert. She has normal strength and normal reflexes. She displays no atrophy. No cranial nerve deficit. She exhibits normal muscle tone. Coordination normal.  Skin: Skin is warm and dry. No rash noted. No erythema. No pallor.  3-4 mm flesh colored smooth oval nevus above elbow b9 appearing   Solar lentigos diffusely   Psychiatric: She has a normal mood and affect.          Assessment & Plan:   Problem List Items Addressed This Visit      Musculoskeletal and Integument   Non-neoplastic nevus of skin    Raised flesh colored smooth 3-4 mm nevus on R arm  Symmetric/round to oval/homogenous in color Benign appearing  Will watch it for change         Other   Right knee pain - Primary    This is ongoing and worse with recent increase in exercise frequency (especially pivoting and stairs)  Per last visit with Dr Lorelei Pont- pos med meniscus tear Recommend nsaid/ice / knee brace and rest/elevation if needed  Next step may be MRI  Will run by sport med

## 2014-06-19 NOTE — Patient Instructions (Signed)
For knee pain - ice knee before and after exercise  Also wear your knee brace  Ibuprofen as needed  Walk/don't run for now  In class - if something hurts knee-stop  I will run this by Dr Lorelei Pont - and get back to you with a plan  We will watch the spot on your arm- it looks benign now - but if it changes in size/shape or color let me know

## 2014-06-19 NOTE — Progress Notes (Signed)
Pre visit review using our clinic review tool, if applicable. No additional management support is needed unless otherwise documented below in the visit note. 

## 2014-06-20 NOTE — Assessment & Plan Note (Signed)
Raised flesh colored smooth 3-4 mm nevus on R arm  Symmetric/round to oval/homogenous in color Benign appearing  Will watch it for change

## 2014-06-20 NOTE — Progress Notes (Signed)
Remember patient. I would get knee MR.

## 2014-06-20 NOTE — Assessment & Plan Note (Signed)
This is ongoing and worse with recent increase in exercise frequency (especially pivoting and stairs)  Per last visit with Dr Lorelei Pont- pos med meniscus tear Recommend nsaid/ice / knee brace and rest/elevation if needed  Next step may be MRI  Will run by sport med

## 2014-06-24 ENCOUNTER — Telehealth: Payer: Self-pay | Admitting: Family Medicine

## 2014-06-24 DIAGNOSIS — M25561 Pain in right knee: Secondary | ICD-10-CM

## 2014-06-24 NOTE — Telephone Encounter (Signed)
Patient aware of MRI.

## 2014-06-24 NOTE — Telephone Encounter (Signed)
Please let pt know that I spoke to Dr Lorelei Pont and plan is to get MRI of the knee  I ordered that so she should be getting a call from our office

## 2014-07-10 ENCOUNTER — Ambulatory Visit (HOSPITAL_COMMUNITY)
Admission: RE | Admit: 2014-07-10 | Discharge: 2014-07-10 | Disposition: A | Discharge status: Home / Self Care | Payer: 59 | Source: Ambulatory Visit | Attending: Family Medicine | Admitting: Family Medicine

## 2014-07-10 DIAGNOSIS — M25561 Pain in right knee: Secondary | ICD-10-CM | POA: Insufficient documentation

## 2014-07-10 DIAGNOSIS — M659 Synovitis and tenosynovitis, unspecified: Secondary | ICD-10-CM | POA: Diagnosis not present

## 2014-07-16 ENCOUNTER — Telehealth: Payer: Self-pay | Admitting: Family Medicine

## 2014-07-16 NOTE — Telephone Encounter (Signed)
Pt would like return phone call regarding MRI that was done last week.  Please call back at [phone]. Thanks.

## 2014-07-16 NOTE — Telephone Encounter (Signed)
Patient informed of MRI results and appointment with Dr Lorelei Pont scheduled.

## 2014-07-16 NOTE — Telephone Encounter (Signed)
I resulted it on mychart  Please call her - see comments Thanks

## 2014-07-18 ENCOUNTER — Ambulatory Visit (INDEPENDENT_AMBULATORY_CARE_PROVIDER_SITE_OTHER): Payer: 59 | Admitting: Family Medicine

## 2014-07-18 ENCOUNTER — Encounter: Payer: Self-pay | Admitting: Family Medicine

## 2014-07-18 VITALS — BP 104/62 | HR 74 | Temp 98.7°F | Ht 67.0 in | Wt 158.0 lb

## 2014-07-18 DIAGNOSIS — M25561 Pain in right knee: Secondary | ICD-10-CM | POA: Diagnosis not present

## 2014-07-18 DIAGNOSIS — M6751 Plica syndrome, right knee: Secondary | ICD-10-CM | POA: Diagnosis not present

## 2014-07-18 MED ORDER — METHYLPREDNISOLONE ACETATE 40 MG/ML IJ SUSP
20.0000 mg | Freq: Once | INTRAMUSCULAR | Status: AC
  Administered 2014-07-18: 20 mg via INTRA_ARTICULAR

## 2014-07-18 NOTE — Progress Notes (Signed)
Pre visit review using our clinic review tool, if applicable. No additional management support is needed unless otherwise documented below in the visit note. 

## 2014-07-19 NOTE — Progress Notes (Signed)
Dr. Frederico Hamman T. Janziel Hockett, MD, [facility] [place], [address] Phone: [phone] Fax: [phone]  07/18/2014  Patient: Brittany Copeland, MRN: [id number], DOB: 01/28/1986, 29 y.o.  Primary Physician:  Loura Pardon, MD  Chief Complaint: Knee Pain  Subjective:   Brittany Copeland is a 29 y.o. very pleasant female patient who presents with the following:  I remember this patient well who I saw number of months ago.  She was having some anterior knee pain that was limiting her exercise, specifically barre.  She also is able to do some spinning classes.  She gets anterior knee pain with certain positions in her classes, and when I saw her last time I thought that she had some probable patellofemoral syndrome as well as a medial plica band.  She has been very active and is done all sorts of treatment for conservative management for these without much success.  She does get some mechanical clicking and symptoms specifically at certain movements during her exercise.  I reviewed her MRI with her in detail.  Past Medical History, Surgical History, Social History, Family History, Problem List, Medications, and Allergies have been reviewed and updated if relevant.  GEN: No fevers, chills. Nontoxic. Primarily MSK c/o today. MSK: Detailed in the HPI GI: tolerating PO intake without difficulty Neuro: No numbness, parasthesias, or tingling associated. Otherwise the pertinent positives of the ROS are noted above.   Objective:   BP 104/62 mmHg  Pulse 74  Temp(Src) 98.7 F (37.1 C) (Oral)  Ht 5\' 7"  (1.702 m)  Wt 158 lb (71.668 kg)  BMI 24.74 kg/m2  LMP 07/05/2014   GEN: WDWN, NAD, Non-toxic, Alert & Oriented x 3 HEENT: Atraumatic, Normocephalic.  Ears and Nose: No external deformity. EXTR: No clubbing/cyanosis/edema NEURO: Normal gait.  PSYCH: Normally interactive. Conversant. Not depressed or anxious appearing.  Calm  demeanor.   Knee:  R Gait: Normal heel toe pattern ROM: hyperext 2 deg, flexion to 135 Effusion: neg Echymosis or edema: none Patellar tendon NT Painful PLICA: MEDIAL PAINFUL PLICA, 2 SMALL FELT Patellar grind: negative Medial and lateral patellar facet loading: negative medial and lateral joint lines:NT Mcmurray's neg Flexion-pinch neg Varus and valgus stress: stable Lachman: neg Ant and Post drawer: neg Hip abduction, IR, ER: WNL Hip flexion str: 5/5 Hip abd: 5/5 Quad: 5/5 VMO atrophy:No Hamstring concentric and eccentric: 5/5   Radiology: Mr Knee Right Wo Contrast  07/10/2014   CLINICAL DATA:  Generalized right knee pain for 1 year specially medially. Intermittent swelling.  EXAM: MRI OF THE RIGHT KNEE WITHOUT CONTRAST  TECHNIQUE: Multiplanar, multisequence MR imaging of the knee was performed. No intravenous contrast was administered.  COMPARISON:  09/11/2013  FINDINGS: MENISCI  Medial meniscus:  Unremarkable  Lateral meniscus:  Unremarkable  LIGAMENTS  Cruciates:  Unremarkable  Collaterals:  Unremarkable  CARTILAGE  Patellofemoral: Minimal patellar chondral heterogeneity along the lateral facet.  Medial:  Unremarkable  Lateral:  Unremarkable  Joint:  Thin medial plica.  Popliteal Fossa:  Unremarkable  Extensor Mechanism: Mild lateral patellar tilt. Tibial tubercle -trochlear groove distance 1.4 cm. Equivocal edema in Hoffa's fat pad just below the lateral patellar facet, images 14-17 of series 4, potentially reflect low-grade synovitis.  Bones: Subtle focus of subcortical edema along the lateral patellar facet, image 13 series 4.  IMPRESSION: 1. The menisci are intact. 2. Low grade chondromalacia along the lateral patellar facet with some adjacent faint degenerative subcortical edema signal along the lateral facet.  There is mild lateral tilt and equivocal focal synovitis in Hoffa's fat pad below the lateral patellar facet. Findings raise the possibility of maltracking, although the  tibial tubercle -trochlear groove distance is normal in the femoral trochlear groove does not appear dysplastic or shallow.   Electronically Signed   By: Van Clines M.D.   On: 07/10/2014 19:57   The radiological images were independently reviewed by myself in the office and results were reviewed with the patient. My independent interpretation of images:  There is T2 signal in the patella mildly, mildly thin patellar cartilage laterally c/w mild chondromalacia. I do not see plical band, but this can be palpated clinically. Electronically Signed  By: Owens Loffler, MD On: 07/19/2014 3:59 PM    Assessment and Plan:   Synovial plica of right knee  Right knee pain - Plan: methylPREDNISolone acetate (DEPO-MEDROL) injection 20 mg  I think the most of her symptoms are coming from the plical band medially which is catching underneath her patella during exercise.  She has failed conservative management thus far.  Ultimately, if all treatment fails, surgical synovectomy could be considered.   Knee, Plica Band Injection, RIGHT Patient verbally consented to procedure. Risks (including potential rare risk of infection and topical skin lightening), benefits, and alternatives explained. Sterilely prepped with Chloraprep. Ethyl cholride used for anesthesia. 2 cc Lidocaine 1% mixed with Depo-Medrol 20 mg injected along plical band; 2 distinct bands marked and found. No complications with procedure and tolerated well. Patient had decreased pain post-injection.   Signed,  Maud Deed. Raea Magallon, MD   Patient's Medications  New Prescriptions   No medications on file  Previous Medications   ALPRAZOLAM (XANAX) 0.25 MG TABLET    Take 1 tablet (0.25 mg total) by mouth 2 (two) times daily as needed for anxiety.   CYCLOBENZAPRINE (FLEXERIL) 10 MG TABLET    Take 1 tablet (10 mg total) by mouth 3 (three) times daily as needed (migraines).   PANTOPRAZOLE (PROTONIX) 40 MG TABLET    Take 1 tablet (40 mg total) by  mouth daily.  Modified Medications   No medications on file  Discontinued Medications   No medications on file

## 2015-04-06 NOTE — L&D Delivery Note (Signed)
Operative Delivery Note At 9:36 PM a viable female was delivered via Vaginal, Vacuum Neurosurgeon).  Presentation: vertex; Position: Right,, Occiput,, Anterior; Station: +4.  Verbal consent: obtained from patient.  Risks and benefits discussed in detail.  Risks include, but are not limited to the risks of anesthesia, bleeding, infection, damage to maternal tissues, fetal cephalhematoma.  There is also the risk of inability to effect vaginal delivery of the head, or shoulder dystocia that cannot be resolved by established maneuvers, leading to the need for emergency cesarean section.  APGAR: 8, 9; weight  .   Placenta status: spontaneously with 3 vessel cord, .   Cord:  with the following complications:nuchal cord x 1 .  Cord pH: not obtained  Anesthesia:  epidural Instruments: Mushroom with 1 pull Episiotomy: None Lacerations: 2nd degree Suture Repair: 3.0 chromic Est. Blood Loss (mL): 300  Mom to postpartum.  Baby to Couplet care / Skin to Skin.  Hisae Decoursey L 11/02/2015, 10:07 PM

## 2015-04-08 ENCOUNTER — Encounter: Payer: Self-pay | Admitting: Family Medicine

## 2015-04-08 ENCOUNTER — Ambulatory Visit (INDEPENDENT_AMBULATORY_CARE_PROVIDER_SITE_OTHER): Payer: BLUE CROSS/BLUE SHIELD | Admitting: Family Medicine

## 2015-04-08 VITALS — BP 120/80 | HR 97 | Temp 98.9°F | Ht 67.0 in | Wt 152.0 lb

## 2015-04-08 DIAGNOSIS — Z331 Pregnant state, incidental: Secondary | ICD-10-CM

## 2015-04-08 DIAGNOSIS — Z3201 Encounter for pregnancy test, result positive: Secondary | ICD-10-CM | POA: Insufficient documentation

## 2015-04-08 DIAGNOSIS — Z3490 Encounter for supervision of normal pregnancy, unspecified, unspecified trimester: Secondary | ICD-10-CM

## 2015-04-08 LAB — POCT URINE PREGNANCY: PREG TEST UR: POSITIVE — AB

## 2015-04-08 NOTE — Progress Notes (Signed)
Subjective:    Patient ID: Brittany Copeland, female    DOB: 25-Jan-1986, 30 y.o.   MRN: [id number]  HPI Here for pos pregnancy test   Was not using birth control Not planned but thrilled   This is her first pregnancy   She has not seen anyone recently  Would like to see Dr Helane Rima   LMP - Oct 24th  That was a normal period (does sometimes skip them) That would put her EDC - around July 31   Feeling awful in general  Is nauseated  Using sea bands  Has not taken any meds  No alcohol (one drink at [facility] before she knew)   Cannot stand the smell of coffee/smoke/etc   Has what to expect when you are expecting book   She has craved gluten free pizza and gluten free pretzels Also milk   Tried 2 different prenatal vitamins- upset her stomach incl gummy one   Patient Active Problem List   Diagnosis Date Noted  . Positive pregnancy test 04/08/2015  . 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 Viral URI with cough 02/26/2014  . Right knee pain 09/11/2013  . 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 Acute maxillary sinusitis 05/08/2013  . Abnormal cervical Pap smear with positive HPV DNA test 04/25/2013  . Celiac disease 04/19/2013  . Routine general medical examination at a health care facility 04/18/2013  . Encounter for routine gynecological examination 04/18/2013  . 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 MIGRAINE HEADACHE 07/20/2006   Past Medical History  Diagnosis Date  . Celiac disease    No past surgical history on file. Social History  Substance Use Topics  . Smoking status: Never Smoker   . Smokeless tobacco: Never Used  . Alcohol Use: 0.0 oz/week    0 Standard drinks or equivalent per week     Comment: socially   Family History  Problem Relation Age of Onset  . Heart disease Father    Allergies  Allergen Reactions  . Augmentin [Amoxicillin-Pot Clavulanate] Nausea And Vomiting  . Gluten Meal     Has celiac disease  . Pineapple    Current  Outpatient Prescriptions on File Prior to Visit  Medication Sig Dispense Refill  . ALPRAZolam (XANAX) 0.25 MG tablet Take 1 tablet (0.25 mg total) by mouth 2 (two) times daily as needed for anxiety. 30 tablet 1  . cyclobenzaprine (FLEXERIL) 10 MG tablet Take 1 tablet (10 mg total) by mouth 3 (three) times daily as needed (migraines). 30 tablet 1  . pantoprazole (PROTONIX) 40 MG tablet Take 1 tablet (40 mg total) by mouth daily. 30 tablet 11   No current facility-administered medications on file prior to visit.     Review of Systems Review of Systems  Constitutional: Negative for fever, appetite change,  and unexpected weight change. pos for fatigue Eyes: Negative for pain and visual disturbance.  Respiratory: Negative for cough and shortness of breath.   Cardiovascular: Negative for cp or palpitations    Gastrointestinal: Negative for  diarrhea and constipation. pos for nausea  Genitourinary: Negative for urgency and frequency.  Skin: Negative for pallor or rash   Neurological: Negative for weakness, light-headedness, numbness and headaches.  Hematological: Negative for adenopathy. Does not bruise/bleed easily.  Psychiatric/Behavioral: Negative for dysphoric mood. The patient is not nervous/anxious.         Objective:   Physical Exam  Constitutional: She appears well-developed and well-nourished. No distress.  Well appearing   HENT:  Head:  Normocephalic and atraumatic.  Mouth/Throat: Oropharynx is clear and moist.  Eyes: Conjunctivae and EOM are normal. Pupils are equal, round, and reactive to light.  Neck: Normal range of motion. Neck supple. No JVD present. Carotid bruit is not present. No thyromegaly present.  Cardiovascular: Normal rate, regular rhythm, normal heart sounds and intact distal pulses.  Exam reveals no gallop.   Pulmonary/Chest: Effort normal and breath sounds normal. No respiratory distress. She has no wheezes. She has no rales.  No crackles  Abdominal: Soft.  Bowel sounds are normal. She exhibits no distension, no abdominal bruit and no mass. There is no tenderness.  Unable to palp fundus over the pelvic bone No tenderness  Musculoskeletal: She exhibits no edema or tenderness.  Lymphadenopathy:    She has no cervical adenopathy.  Neurological: She is alert. She has normal reflexes.  Skin: Skin is warm and dry. No rash noted.  Psychiatric: She has a normal mood and affect.          Assessment & Plan:   Problem List Items Addressed This Visit      Other   Positive pregnancy test - Primary    By LMP probably about 10 weeks -pt is very excited  Pt has literature /book "what to expect"  No meds or alcohol Disc imp of regular meals and avoidance of above and caffeine  Has not tolerated PNV- will try flinstones for now  Ref to obgyn for further eval and management Will update if problems or concerns        Relevant Orders   Ambulatory referral to Obstetrics / Gynecology    Other Visit Diagnoses    Pregnant        Relevant Orders    POCT urine pregnancy (Completed)

## 2015-04-08 NOTE — Progress Notes (Signed)
Pre visit review using our clinic review tool, if applicable. No additional management support is needed unless otherwise documented below in the visit note. 11![id number]

## 2015-04-08 NOTE — Patient Instructions (Signed)
Start a [employer] children's vitamin daily with a meal or a snack  Stop at check out for referral to obgyn  Avoid alcohol/ medication/ smoking/ caffeine   If any problems please let me know

## 2015-04-10 NOTE — Assessment & Plan Note (Signed)
By LMP probably about 10 weeks -pt is very excited  Pt has literature /book "what to expect"  No meds or alcohol Disc imp of regular meals and avoidance of above and caffeine  Has not tolerated PNV- will try flinstones for now  Ref to obgyn for further eval and management Will update if problems or concerns

## 2015-06-20 ENCOUNTER — Ambulatory Visit (INDEPENDENT_AMBULATORY_CARE_PROVIDER_SITE_OTHER): Payer: BLUE CROSS/BLUE SHIELD | Admitting: Family Medicine

## 2015-06-20 ENCOUNTER — Encounter: Payer: Self-pay | Admitting: Family Medicine

## 2015-06-20 VITALS — BP 108/70 | HR 93 | Temp 98.9°F | Ht 67.0 in | Wt 148.0 lb

## 2015-06-20 DIAGNOSIS — J0100 Acute maxillary sinusitis, unspecified: Secondary | ICD-10-CM | POA: Diagnosis not present

## 2015-06-20 MED ORDER — AMOXICILLIN 500 MG PO CAPS: 500.0000 mg | ORAL_CAPSULE | Freq: Two times a day (BID) | ORAL | Status: DC

## 2015-06-20 NOTE — Progress Notes (Signed)
Subjective:    Patient ID: Brittany Copeland, female    DOB: May 21, 1985, 30 y.o.   MRN: [id number]  HPI Here with cough   Started 4 wk ago - got zpack from gyn  Got better and then worse (improved - fever went away but cough did not totally go away) Sneezing and upper resp symptoms  Headache since last week  Now pain in R maxillary sinus pain   No more fever now - felt feverish but no fever - was cold and then hot (? From pregnancy)   Pregnancy is going well overall  Morning sickness is over   Patient Active Problem List   Diagnosis Date Noted  . Positive pregnancy test 04/08/2015  . 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 Viral URI with cough 02/26/2014  . Right knee pain 09/11/2013  . 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 Acute maxillary sinusitis 05/08/2013  . Abnormal cervical Pap smear with positive HPV DNA test 04/25/2013  . Celiac disease 04/19/2013  . Routine general medical examination at a health care facility 04/18/2013  . Encounter for routine gynecological examination 04/18/2013  . 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 MIGRAINE HEADACHE 07/20/2006   Past Medical History  Diagnosis Date  . Celiac disease    No past surgical history on file. Social History  Substance Use Topics  . Smoking status: Never Smoker   . Smokeless tobacco: Never Used  . Alcohol Use: No   Family History  Problem Relation Age of Onset  . Heart disease Father    Allergies  Allergen Reactions  . Augmentin [Amoxicillin-Pot Clavulanate] Nausea And Vomiting  . Gluten Meal     Has celiac disease  . Pineapple    No current outpatient prescriptions on file prior to visit.   No current facility-administered medications on file prior to visit.      Review of Systems  Constitutional: Positive for appetite change. Negative for fever and fatigue.  HENT: Positive for congestion, ear pain, postnasal drip, rhinorrhea, sinus pressure and sore throat. Negative for  nosebleeds.   Eyes: Negative for pain, redness and itching.  Respiratory: Positive for cough. Negative for shortness of breath and wheezing.   Cardiovascular: Negative for chest pain.  Gastrointestinal: Negative for nausea, vomiting, abdominal pain and diarrhea.  Endocrine: Negative for polyuria.  Genitourinary: Negative for dysuria, urgency and frequency.  Musculoskeletal: Negative for myalgias and arthralgias.  Allergic/Immunologic: Negative for immunocompromised state.  Neurological: Positive for headaches. Negative for dizziness, tremors, syncope, weakness and numbness.  Hematological: Negative for adenopathy. Does not bruise/bleed easily.  Psychiatric/Behavioral: Negative for dysphoric mood. The patient is not nervous/anxious.        Objective:   Physical Exam  Constitutional: She appears well-developed and well-nourished. No distress.  HENT:  Head: Normocephalic and atraumatic.  Right Ear: External ear normal.  Left Ear: External ear normal.  Mouth/Throat: Oropharynx is clear and moist. No oropharyngeal exudate.  Nares are injected and congested  Bilateral maxillary sinus tenderness -worse on the R Post nasal drip   Eyes: Conjunctivae and EOM are normal. Pupils are equal, round, and reactive to light. Right eye exhibits no discharge. Left eye exhibits no discharge.  Neck: Normal range of motion. Neck supple.  Cardiovascular: Normal rate and regular rhythm.   Pulmonary/Chest: Effort normal and breath sounds normal. No respiratory distress. She has no wheezes. She has no rales.  Lymphadenopathy:    She has no cervical adenopathy.  Neurological: She is alert. No cranial nerve deficit.  Skin: Skin is warm and dry. No rash noted.  Psychiatric: She has a normal mood and affect.          Assessment & Plan:   Problem List Items Addressed This Visit      Respiratory   Acute maxillary sinusitis - Primary    In pregnant pt  Ongoing cough Cover with amox  Take the  amoxicillin as directed  Take it with food  Drink lots of fluids Nasal saline spray and or/netti pot to clear your sinuses Warm compresses on face also  Breathe steam    Update if not starting to improve in a week or if worsening        Relevant Medications   amoxicillin (AMOXIL) 500 MG capsule

## 2015-06-20 NOTE — Progress Notes (Signed)
Pre visit review using our clinic review tool, if applicable. No additional management support is needed unless otherwise documented below in the visit note. 

## 2015-06-20 NOTE — Patient Instructions (Signed)
Take the amoxicillin as directed  Take it with food  Drink lots of fluids Nasal saline spray and or/netti pot to clear your sinuses Warm compresses on face also  Breathe steam    Update if not starting to improve in a week or if worsening

## 2015-06-22 NOTE — Assessment & Plan Note (Signed)
In pregnant pt  Ongoing cough Cover with amox  Take the amoxicillin as directed  Take it with food  Drink lots of fluids Nasal saline spray and or/netti pot to clear your sinuses Warm compresses on face also  Breathe steam    Update if not starting to improve in a week or if worsening

## 2015-10-11 LAB — OB RESULTS CONSOLE HIV ANTIBODY (ROUTINE TESTING): HIV: NONREACTIVE

## 2015-10-11 LAB — OB RESULTS CONSOLE RUBELLA ANTIBODY, IGM: Rubella: IMMUNE

## 2015-10-11 LAB — OB RESULTS CONSOLE ABO/RH: RH TYPE: POSITIVE

## 2015-10-11 LAB — OB RESULTS CONSOLE GC/CHLAMYDIA
CHLAMYDIA, DNA PROBE: NEGATIVE
Gonorrhea: NEGATIVE

## 2015-10-11 LAB — OB RESULTS CONSOLE HEPATITIS B SURFACE ANTIGEN: Hepatitis B Surface Ag: NEGATIVE

## 2015-10-11 LAB — OB RESULTS CONSOLE ANTIBODY SCREEN: ANTIBODY SCREEN: NEGATIVE

## 2015-10-11 LAB — OB RESULTS CONSOLE RPR: RPR: NONREACTIVE

## 2015-10-31 ENCOUNTER — Encounter (HOSPITAL_COMMUNITY): Payer: Self-pay | Admitting: *Deleted

## 2015-10-31 ENCOUNTER — Telehealth (HOSPITAL_COMMUNITY): Payer: Self-pay | Admitting: *Deleted

## 2015-10-31 LAB — OB RESULTS CONSOLE GBS: STREP GROUP B AG: NEGATIVE

## 2015-10-31 NOTE — Telephone Encounter (Signed)
Preadmission screen  

## 2015-11-02 ENCOUNTER — Inpatient Hospital Stay (HOSPITAL_COMMUNITY): Payer: BLUE CROSS/BLUE SHIELD | Admitting: Anesthesiology

## 2015-11-02 ENCOUNTER — Encounter (HOSPITAL_COMMUNITY): Payer: Self-pay | Admitting: *Deleted

## 2015-11-02 ENCOUNTER — Inpatient Hospital Stay (HOSPITAL_COMMUNITY)
Admission: AD | Admit: 2015-11-02 | Discharge: 2015-11-04 | DRG: 775 | Disposition: A | Discharge status: Home / Self Care | Payer: BLUE CROSS/BLUE SHIELD | Source: Ambulatory Visit | Attending: Obstetrics and Gynecology | Admitting: Obstetrics and Gynecology

## 2015-11-02 DIAGNOSIS — O4290 Premature rupture of membranes, unspecified as to length of time between rupture and onset of labor, unspecified weeks of gestation: Secondary | ICD-10-CM | POA: Diagnosis present

## 2015-11-02 DIAGNOSIS — K8681 Exocrine pancreatic insufficiency: Secondary | ICD-10-CM | POA: Diagnosis present

## 2015-11-02 DIAGNOSIS — K900 Celiac disease: Secondary | ICD-10-CM | POA: Diagnosis present

## 2015-11-02 DIAGNOSIS — Z3A39 39 weeks gestation of pregnancy: Secondary | ICD-10-CM

## 2015-11-02 LAB — CBC
HEMATOCRIT: 34.2 % — AB (ref 36.0–46.0)
Hemoglobin: 11.6 g/dL — ABNORMAL LOW (ref 12.0–15.0)
MCH: 29.4 pg (ref 26.0–34.0)
MCHC: 33.9 g/dL (ref 30.0–36.0)
MCV: 86.8 fL (ref 78.0–100.0)
Platelets: 232 10*3/uL (ref 150–400)
RBC: 3.94 MIL/uL (ref 3.87–5.11)
RDW: 13.6 % (ref 11.5–15.5)
WBC: 11.9 10*3/uL — AB (ref 4.0–10.5)

## 2015-11-02 LAB — URINE MICROSCOPIC-ADD ON
BACTERIA UA: NONE SEEN
WBC, UA: NONE SEEN WBC/hpf (ref 0–5)

## 2015-11-02 LAB — ABO/RH: ABO/RH(D): B POS

## 2015-11-02 LAB — URINALYSIS, ROUTINE W REFLEX MICROSCOPIC
Bilirubin Urine: NEGATIVE
Glucose, UA: NEGATIVE mg/dL
Ketones, ur: NEGATIVE mg/dL
Leukocytes, UA: NEGATIVE
Nitrite: NEGATIVE
PH: 6.5 (ref 5.0–8.0)
Protein, ur: NEGATIVE mg/dL
SPECIFIC GRAVITY, URINE: 1.01 (ref 1.005–1.030)

## 2015-11-02 LAB — POCT FERN TEST: POCT FERN TEST: POSITIVE

## 2015-11-02 LAB — TYPE AND SCREEN
ABO/RH(D): B POS
Antibody Screen: NEGATIVE

## 2015-11-02 LAB — RPR: RPR: NONREACTIVE

## 2015-11-02 MED ORDER — PRENATAL MULTIVITAMIN CH
1.0000 | ORAL_TABLET | Freq: Every day | ORAL | Status: DC
  Administered 2015-11-03: 1 via ORAL
  Filled 2015-11-02 (×2): qty 1

## 2015-11-02 MED ORDER — ZOLPIDEM TARTRATE 5 MG PO TABS: 5.0000 mg | ORAL_TABLET | Freq: Every evening | ORAL | Status: DC | PRN

## 2015-11-02 MED ORDER — LIDOCAINE HCL (PF) 1 % IJ SOLN
INTRAMUSCULAR | Status: DC | PRN
  Administered 2015-11-02 (×2): 4 mL

## 2015-11-02 MED ORDER — OXYCODONE-ACETAMINOPHEN 5-325 MG PO TABS: 2.0000 | ORAL_TABLET | ORAL | Status: DC | PRN

## 2015-11-02 MED ORDER — DIPHENHYDRAMINE HCL 25 MG PO CAPS: 25.0000 mg | ORAL_CAPSULE | Freq: Four times a day (QID) | ORAL | Status: DC | PRN

## 2015-11-02 MED ORDER — SENNOSIDES-DOCUSATE SODIUM 8.6-50 MG PO TABS
2.0000 | ORAL_TABLET | ORAL | Status: DC
  Administered 2015-11-03 (×2): 2 via ORAL
  Filled 2015-11-02 (×2): qty 2

## 2015-11-02 MED ORDER — LACTATED RINGERS IV SOLN
500.0000 mL | Freq: Once | INTRAVENOUS | Status: AC
  Administered 2015-11-02: 500 mL via INTRAVENOUS

## 2015-11-02 MED ORDER — EPHEDRINE 5 MG/ML INJ: 10.0000 mg | INTRAVENOUS | Status: DC | PRN

## 2015-11-02 MED ORDER — OXYTOCIN BOLUS FROM INFUSION
500.0000 mL | Freq: Once | INTRAVENOUS | Status: AC
Start: 2015-11-02 — End: 2015-11-02
  Administered 2015-11-02: 500 mL via INTRAVENOUS

## 2015-11-02 MED ORDER — FLEET ENEMA 7-19 GM/118ML RE ENEM: 1.0000 | ENEMA | Freq: Every day | RECTAL | Status: DC | PRN

## 2015-11-02 MED ORDER — FLEET ENEMA 7-19 GM/118ML RE ENEM: 1.0000 | ENEMA | RECTAL | Status: DC | PRN

## 2015-11-02 MED ORDER — PHENYLEPHRINE 40 MCG/ML (10ML) SYRINGE FOR IV PUSH (FOR BLOOD PRESSURE SUPPORT)
80.0000 ug | PREFILLED_SYRINGE | INTRAVENOUS | Status: DC | PRN
  Filled 2015-11-02: qty 10

## 2015-11-02 MED ORDER — MEDROXYPROGESTERONE ACETATE 150 MG/ML IM SUSP: 150.0000 mg | INTRAMUSCULAR | Status: DC | PRN

## 2015-11-02 MED ORDER — IBUPROFEN 600 MG PO TABS
600.0000 mg | ORAL_TABLET | Freq: Four times a day (QID) | ORAL | Status: DC
  Administered 2015-11-03 – 2015-11-04 (×6): 600 mg via ORAL
  Filled 2015-11-02 (×7): qty 1

## 2015-11-02 MED ORDER — ONDANSETRON HCL 4 MG/2ML IJ SOLN: 4.0000 mg | Freq: Four times a day (QID) | INTRAMUSCULAR | Status: DC | PRN

## 2015-11-02 MED ORDER — ONDANSETRON HCL 4 MG/2ML IJ SOLN: 4.0000 mg | INTRAMUSCULAR | Status: DC | PRN

## 2015-11-02 MED ORDER — DIBUCAINE 1 % RE OINT
1.0000 "application " | TOPICAL_OINTMENT | RECTAL | Status: DC | PRN
  Administered 2015-11-04: 1 via RECTAL
  Filled 2015-11-02: qty 28

## 2015-11-02 MED ORDER — COCONUT OIL OIL
1.0000 "application " | TOPICAL_OIL | Status: DC | PRN
  Administered 2015-11-03: 1 via TOPICAL
  Filled 2015-11-02: qty 120

## 2015-11-02 MED ORDER — OXYCODONE-ACETAMINOPHEN 5-325 MG PO TABS
2.0000 | ORAL_TABLET | ORAL | Status: DC | PRN
  Administered 2015-11-04: 2 via ORAL
  Filled 2015-11-02: qty 2

## 2015-11-02 MED ORDER — NALBUPHINE HCL 10 MG/ML IJ SOLN
10.0000 mg | INTRAMUSCULAR | Status: DC | PRN
  Administered 2015-11-02: 10 mg via INTRAVENOUS
  Filled 2015-11-02: qty 1

## 2015-11-02 MED ORDER — PHENYLEPHRINE 40 MCG/ML (10ML) SYRINGE FOR IV PUSH (FOR BLOOD PRESSURE SUPPORT): 80.0000 ug | PREFILLED_SYRINGE | INTRAVENOUS | Status: DC | PRN

## 2015-11-02 MED ORDER — TERBUTALINE SULFATE 1 MG/ML IJ SOLN: 0.2500 mg | Freq: Once | INTRAMUSCULAR | Status: DC | PRN

## 2015-11-02 MED ORDER — LACTATED RINGERS IV SOLN
INTRAVENOUS | Status: DC
  Administered 2015-11-02: 15:00:00 via INTRAVENOUS

## 2015-11-02 MED ORDER — ONDANSETRON HCL 4 MG PO TABS: 4.0000 mg | ORAL_TABLET | ORAL | Status: DC | PRN

## 2015-11-02 MED ORDER — TETANUS-DIPHTH-ACELL PERTUSSIS 5-2.5-18.5 LF-MCG/0.5 IM SUSP: 0.5000 mL | Freq: Once | INTRAMUSCULAR | Status: DC

## 2015-11-02 MED ORDER — SOD CITRATE-CITRIC ACID 500-334 MG/5ML PO SOLN
30.0000 mL | ORAL | Status: DC | PRN
Start: 2015-11-02 — End: 2015-11-02

## 2015-11-02 MED ORDER — BENZOCAINE-MENTHOL 20-0.5 % EX AERO
1.0000 "application " | INHALATION_SPRAY | CUTANEOUS | Status: DC | PRN
  Administered 2015-11-03: 1 via TOPICAL
  Filled 2015-11-02: qty 56

## 2015-11-02 MED ORDER — ACETAMINOPHEN 325 MG PO TABS: 650.0000 mg | ORAL_TABLET | ORAL | Status: DC | PRN

## 2015-11-02 MED ORDER — OXYTOCIN 40 UNITS IN LACTATED RINGERS INFUSION - SIMPLE MED: 2.5000 [IU]/h | INTRAVENOUS | Status: DC

## 2015-11-02 MED ORDER — OXYCODONE-ACETAMINOPHEN 5-325 MG PO TABS
1.0000 | ORAL_TABLET | ORAL | Status: DC | PRN
Start: 2015-11-02 — End: 2015-11-02

## 2015-11-02 MED ORDER — OXYTOCIN 40 UNITS IN LACTATED RINGERS INFUSION - SIMPLE MED
1.0000 m[IU]/min | INTRAVENOUS | Status: DC
  Administered 2015-11-02: 2 m[IU]/min via INTRAVENOUS
  Filled 2015-11-02: qty 1000

## 2015-11-02 MED ORDER — PROMETHAZINE HCL 25 MG/ML IJ SOLN
12.5000 mg | INTRAMUSCULAR | Status: DC | PRN
Start: 2015-11-02 — End: 2015-11-02
  Administered 2015-11-02: 12.5 mg via INTRAVENOUS
  Filled 2015-11-02: qty 1

## 2015-11-02 MED ORDER — MEASLES, MUMPS & RUBELLA VAC ~~LOC~~ INJ
0.5000 mL | INJECTION | Freq: Once | SUBCUTANEOUS | Status: DC
  Filled 2015-11-02: qty 0.5

## 2015-11-02 MED ORDER — LACTATED RINGERS IV SOLN: 500.0000 mL | INTRAVENOUS | Status: DC | PRN

## 2015-11-02 MED ORDER — OXYCODONE-ACETAMINOPHEN 5-325 MG PO TABS
1.0000 | ORAL_TABLET | ORAL | Status: DC | PRN
Start: 2015-11-02 — End: 2015-11-04
  Administered 2015-11-03: 1 via ORAL
  Filled 2015-11-02: qty 1

## 2015-11-02 MED ORDER — LIDOCAINE HCL (PF) 1 % IJ SOLN
30.0000 mL | INTRAMUSCULAR | Status: DC | PRN
  Administered 2015-11-02: 30 mL via SUBCUTANEOUS
  Filled 2015-11-02: qty 30

## 2015-11-02 MED ORDER — FENTANYL 2.5 MCG/ML BUPIVACAINE 1/10 % EPIDURAL INFUSION (WH - ANES)
14.0000 mL/h | INTRAMUSCULAR | Status: DC | PRN
  Administered 2015-11-02 (×2): 14 mL/h via EPIDURAL
  Filled 2015-11-02 (×2): qty 125

## 2015-11-02 MED ORDER — WITCH HAZEL-GLYCERIN EX PADS
1.0000 "application " | MEDICATED_PAD | CUTANEOUS | Status: DC | PRN
  Administered 2015-11-04: 1 via TOPICAL

## 2015-11-02 MED ORDER — BISACODYL 10 MG RE SUPP: 10.0000 mg | Freq: Every day | RECTAL | Status: DC | PRN

## 2015-11-02 MED ORDER — DIPHENHYDRAMINE HCL 50 MG/ML IJ SOLN: 12.5000 mg | INTRAMUSCULAR | Status: DC | PRN

## 2015-11-02 MED ORDER — ACETAMINOPHEN 325 MG PO TABS
650.0000 mg | ORAL_TABLET | ORAL | Status: DC | PRN
  Administered 2015-11-03: 650 mg via ORAL
  Filled 2015-11-02: qty 2

## 2015-11-02 MED ORDER — SIMETHICONE 80 MG PO CHEW: 80.0000 mg | CHEWABLE_TABLET | ORAL | Status: DC | PRN

## 2015-11-02 NOTE — Anesthesia Procedure Notes (Signed)
Epidural Patient location during procedure: OB  Staffing Anesthesiologist: Lauretta Grill Performed: anesthesiologist   Preanesthetic Checklist Completed: patient identified, site marked, surgical consent, pre-op evaluation, timeout performed, IV checked, risks and benefits discussed and monitors and equipment checked  Epidural Patient position: sitting Prep: site prepped and draped and DuraPrep Patient monitoring: continuous pulse ox and blood pressure Approach: midline Location: L3-L4 Injection technique: LOR saline  Needle:  Needle type: Tuohy  Needle gauge: 17 G Needle length: 9 cm and 9 Needle insertion depth: 5 cm cm Catheter type: closed end flexible Catheter size: 19 Gauge Catheter at skin depth: 10 cm Test dose: negative  Assessment Events: blood not aspirated, injection not painful, no injection resistance, negative IV test and no paresthesia  Additional Notes Patient identified. Risks/Benefits/Options discussed with patient including but not limited to bleeding, infection, nerve damage, paralysis, failed block, incomplete pain control, headache, blood pressure changes, nausea, vomiting, reactions to medication both or allergic, itching and postpartum back pain. Confirmed with bedside nurse the patient's most recent platelet count. Confirmed with patient that they are not currently taking any anticoagulation, have any bleeding history or any family history of bleeding disorders. Patient expressed understanding and wished to proceed. All questions were answered. Sterile technique was used throughout the entire procedure. Please see nursing notes for vital signs. Test dose was given through epidural catheter and negative prior to continuing to dose epidural or start infusion. Warning signs of high block given to the patient including shortness of breath, tingling/numbness in hands, complete motor block, or any concerning symptoms with instructions to call for help. Patient was  given instructions on fall risk and not to get out of bed. All questions and concerns addressed with instructions to call with any issues or inadequate analgesia.

## 2015-11-02 NOTE — MAU Note (Signed)
Had gush of fld at 0445 and has not really leaked anymore since then except when goes to [facility] notices some fld.  Fld slight pink tinge. Mild contractions.

## 2015-11-02 NOTE — Anesthesia Preprocedure Evaluation (Signed)
Anesthesia Evaluation  Patient identified by MRN, date of birth, ID band Patient awake    Reviewed: Allergy & Precautions, NPO status , Patient's Chart, lab work & pertinent test results  History of Anesthesia Complications Negative for: history of anesthetic complications  Airway Mallampati: II  TM Distance: >3 FB Neck ROM: Full    Dental no notable dental hx. (+) Dental Advisory Given   Pulmonary neg pulmonary ROS,    Pulmonary exam normal breath sounds clear to auscultation       Cardiovascular negative cardio ROS Normal cardiovascular exam Rhythm:Regular Rate:Normal     Neuro/Psych  Headaches, negative psych ROS   GI/Hepatic negative GI ROS, Neg liver ROS,   Endo/Other  negative endocrine ROS  Renal/GU negative Renal ROS  negative genitourinary   Musculoskeletal negative musculoskeletal ROS (+)   Abdominal   Peds negative pediatric ROS (+)  Hematology negative hematology ROS (+)   Anesthesia Other Findings   Reproductive/Obstetrics negative OB ROS                            Anesthesia Physical Anesthesia Plan  ASA: II  Anesthesia Plan: Epidural   Post-op Pain Management:    Induction:   Airway Management Planned:   Additional Equipment:   Intra-op Plan:   Post-operative Plan:   Informed Consent: I have reviewed the patients History and Physical, chart, labs and discussed the procedure including the risks, benefits and alternatives for the proposed anesthesia with the patient or authorized representative who has indicated his/her understanding and acceptance.   Dental advisory given  Plan Discussed with: CRNA  Anesthesia Plan Comments:         Anesthesia Quick Evaluation

## 2015-11-02 NOTE — Progress Notes (Signed)
Pt states she is feeling dizzy.  HOB lowered O2 sat 100%

## 2015-11-02 NOTE — Anesthesia Pain Management Evaluation Note (Signed)
  CRNA Pain Management Visit Note  Patient: Brittany Copeland, 30 y.o., female  "Hello I am a member of the anesthesia team at [facility]. We have an anesthesia team available at all times to provide care throughout the hospital, including epidural management and anesthesia for C-section. I don't know your plan for the delivery whether it a natural birth, water birth, IV sedation, nitrous supplementation, doula or epidural, but we want to meet your pain goals."   1.Was your pain managed to your expectations on prior hospitalizations?     2.What is your expectation for pain management during this hospitalization?       3.How can we help you reach that goal?   Record the patient's initial score and the patient's pain goal.   Pain: 1  Pain Goal: 3 [facility] wants you to be able to say your pain was always managed very well.  Jaleeya Mcnelly Hristova 11/02/2015

## 2015-11-02 NOTE — Progress Notes (Signed)
FHR tracing intermittent.  Cardio adjusted several times.  Pt frequently changing position after RN leaves the room

## 2015-11-02 NOTE — Progress Notes (Signed)
Patient is doing well. She desires epidural.  BP (!) 150/74   Pulse 68   Temp 97.9 F (36.6 C) (Axillary)   Resp 16   Ht 5\' 9"  (1.753 m)   Wt 80.2 kg (176 lb 12.8 oz)   LMP 01/27/2015   SpO2 100%   BMI 26.11 kg/m  FHR Category 1 Cervix is 100%/ 3 cm -1 Vertex Continue Pitocin Epidural now

## 2015-11-02 NOTE — H&P (Signed)
Brittany Copeland is a 30 y.o. G 1 P 0 at 21 w 6 days presents with SROM this am and irregular contractions. OB History    Gravida Para Term Preterm AB Living   2 0     1     SAB TAB Ectopic Multiple Live Births     1           Past Medical History:  Diagnosis Date  . Celiac disease   . Headache   . Vaginal Pap smear, abnormal    Past Surgical History:  Procedure Laterality Date  . ADENOIDECTOMY     Family History: family history includes Cancer in her paternal grandfather; Diabetes in her maternal grandmother; Heart disease in her father and paternal uncle; Hypertension in her father; Seizures in her mother; Thyroid disease in her maternal grandmother. Social History:  reports that she has never smoked. She has never used smokeless tobacco. She reports that she does not drink alcohol or use drugs.     Maternal Diabetes: No Genetic Screening: Normal Maternal Ultrasounds/Referrals: Normal Fetal Ultrasounds or other Referrals:  None Maternal Substance Abuse:  No Significant Maternal Medications:  None Significant Maternal Lab Results:  None Other Comments:  None  Review of Systems  All other systems reviewed and are negative.  Maternal Medical History:  Reason for admission: Rupture of membranes and contractions.   Prenatal complications: no prenatal complications   Dilation: 1.5 Effacement (%): 70 Station: -2, -3 Exam by:: Becca RN Blood pressure (!) 145/81, pulse 90, temperature 98.1 F (36.7 C), temperature source Axillary, resp. rate 18, height 5\' 9"  (1.753 m), weight 80.2 kg (176 lb 12.8 oz), last menstrual period 01/27/2015. Maternal Exam:  Uterine Assessment: Contraction strength is moderate.  Contraction frequency is irregular.   Abdomen: Fetal presentation: vertex     Fetal Exam Fetal State Assessment: Category I - tracings are normal.     Physical Exam  Nursing note and vitals reviewed. Constitutional: She appears well-developed and  well-nourished.  HENT:  Head: Normocephalic.  Eyes: Pupils are equal, round, and reactive to light.  Neck: Normal range of motion.  Cardiovascular: Normal rate and regular rhythm.   Respiratory: Effort normal.  GI: Soft.    Prenatal labs: ABO, Rh: B/Positive/-- (07/08 0000) Antibody: Negative (07/08 0000) Rubella: Immune (07/08 0000) RPR: Nonreactive (07/08 0000)  HBsAg: Negative (07/08 0000)  HIV: Non-reactive (07/08 0000)  GBS: Negative (07/28 0000)   Assessment/Plan: IUP at 110 w 6 days PPROM Admit Pitocin prn augmentation   Janice Bodine L 11/02/2015, 7:46 AM

## 2015-11-03 ENCOUNTER — Telehealth (HOSPITAL_COMMUNITY): Payer: Self-pay | Admitting: *Deleted

## 2015-11-03 LAB — CBC
HCT: 29.2 % — ABNORMAL LOW (ref 36.0–46.0)
Hemoglobin: 9.7 g/dL — ABNORMAL LOW (ref 12.0–15.0)
MCH: 29.1 pg (ref 26.0–34.0)
MCHC: 33.2 g/dL (ref 30.0–36.0)
MCV: 87.7 fL (ref 78.0–100.0)
PLATELETS: 213 10*3/uL (ref 150–400)
RBC: 3.33 MIL/uL — ABNORMAL LOW (ref 3.87–5.11)
RDW: 14.2 % (ref 11.5–15.5)
WBC: 15.9 10*3/uL — ABNORMAL HIGH (ref 4.0–10.5)

## 2015-11-03 NOTE — Lactation Note (Signed)
This note was copied from a baby's chart. Lactation Consultation Note Called to assist to latch d/t severe painful latches. Mom is unable to tolerate baby latching. NS #16 applied, mom for a minute stated much better, then yelled out pulling baby. Unlatched baby, applied #20NS. I felt like it was to large but it felt the same way as #16NS. Mom stated she just couldn't tolerate it. Mom did have a cressent shaped light bruise to outer Rt. Nipple. W/gloved finger, assessed baby's suck. Appeared to be easy w/o biting or clamping down. The suck wasn't very strong on my finger. Applied moms shells. Since baby had fallen to sleep while discussing situation, encouraged mom to sleep as well. Mom is fair skinned. Baby had wide flange, repositioned baby. Mom stated the latches are hurting worse than before. LC explained may be d/t bruising. Encouraged mom to regroup and rest until next feeding.  Patient Name: Brittany Copeland [id number] Date: 11/03/2015 Reason for consult: Follow-up assessment;Difficult latch;Breast/nipple pain   Maternal Data Has patient been taught Hand Expression?: Yes Does the patient have breastfeeding experience prior to this delivery?: No  Feeding Feeding Type: Breast Fed Length of feed: 0 min  LATCH Score/Interventions Latch: Repeated attempts needed to sustain latch, nipple held in mouth throughout feeding, stimulation needed to elicit sucking reflex. Intervention(s): Adjust position;Assist with latch;Breast massage;Breast compression  Audible Swallowing: None Intervention(s): Skin to skin;Hand expression  Type of Nipple: Everted at rest and after stimulation Intervention(s): Shells;Hand pump  Comfort (Breast/Nipple): Engorged, cracked, bleeding, large blisters, severe discomfort Problem noted: Cracked, bleeding, blisters, bruises     Hold (Positioning): Full assist, staff holds infant at breast Intervention(s): Breastfeeding basics reviewed;Support Pillows;Position  options;Skin to skin  LATCH Score: 3  Lactation Tools Discussed/Used Tools: Shells;Nipple Shields;Pump;Comfort gels Nipple shield size: 16;20 Shell Type: Inverted Breast pump type: Manual Pump Review: Setup, frequency, and cleaning;Milk Storage Initiated by:: Allayne Stack RN IBCLC Date initiated:: 11/03/15   Consult Status Consult Status: Follow-up Date: 11/03/15 (in pm) Follow-up type: In-patient    Sonam Huelsmann, Elta [place] 11/03/2015, 5:00 AM

## 2015-11-03 NOTE — Lactation Note (Signed)
This note was copied from a baby's chart. Lactation Consultation Note New mom stating baby is cluster feeding. Mom states her Lt. Nipple is hurting a lot. Noted Lt. Nipple to have elongated blister. Looks very painful!! Comfort gels given. Mom has semi flat nipples, very short shaft, does evert w/stimulation. The baby hasn't been getting a deep latch. RN has helped mom latch in several different positions. Encouraged mom to BF in football for a deeper latch at this time. Gave mom shells to wear in bra between feedings after wearing CG. Mom encouraged to feed baby 8-12 times/24 hours and with feeding cues. Referred to Baby and Me Book in Breastfeeding section Pg. 22-23 for position options and Proper latch demonstration. Discussed obtaining a deeper latch. Encouraged to rest Lt. Nipple for next couple of feedings. Hand expression taught w/colostrum noted from Rt. Breast. Unable to tolerate hand expression to Lt. Breast d/t tenderness of nipple.  Call for assistance for next feeding. The baby has nasal congestion. Encouraged grandmother to hold baby upright some to aide in baby breathing better. Educated about newborn behavior, STS, I&O, supply and demand. [place] brochure given w/resources, support groups and [place] services. Patient Name: Brittany Copeland [id number] Date: 11/03/2015 Reason for consult: Initial assessment   Maternal Data Has patient been taught Hand Expression?: Yes Does the patient have breastfeeding experience prior to this delivery?: No  Feeding Feeding Type: Breast Fed Length of feed: 30 min  LATCH Score/Interventions Latch: Grasps breast easily, tongue down, lips flanged, rhythmical sucking. Intervention(s): Adjust position  Audible Swallowing: Spontaneous and intermittent Intervention(s): Skin to skin  Type of Nipple: Everted at rest and after stimulation (short shaft)  Comfort (Breast/Nipple): Engorged, cracked, bleeding, large blisters, severe discomfort Problem noted:  Cracked, bleeding, blisters, bruises     Hold (Positioning): Assistance needed to correctly position infant at breast and maintain latch. Intervention(s): Breastfeeding basics reviewed;Support Pillows;Position options;Skin to skin  LATCH Score: 9  Lactation Tools Discussed/Used Tools: Shells;Pump;Comfort gels Shell Type: Inverted Breast pump type: Manual Pump Review: Setup, frequency, and cleaning;Milk Storage Initiated by:: Allayne Stack RN IBCLC Date initiated:: 11/03/15   Consult Status Consult Status: Follow-up Date: 11/03/15 (in pm) Follow-up type: In-patient    Theodoro Kalata 11/03/2015, 2:44 AM

## 2015-11-03 NOTE — Anesthesia Postprocedure Evaluation (Signed)
Anesthesia Post Note  Patient: Brittany Copeland  Procedure(s) Performed: * No procedures listed *  Patient location during evaluation: Mother Baby Anesthesia Type: Epidural Level of consciousness: awake and alert Pain management: pain level controlled Vital Signs Assessment: post-procedure vital signs reviewed and stable Respiratory status: spontaneous breathing and nonlabored ventilation Cardiovascular status: stable Postop Assessment: no headache, patient able to bend at knees, no backache, no signs of nausea or vomiting, epidural receding and adequate PO intake Anesthetic complications: no     Last Vitals:  Vitals:   11/03/15 0430 11/03/15 1200  BP: 122/75 115/80  Pulse: 96 86  Resp: 18 18  Temp: 36.9 C 36.8 C    Last Pain:  Vitals:   11/03/15 1200  TempSrc: Oral  PainSc: 2    Pain Goal: Patients Stated Pain Goal: 0 (11/02/15 0610)               Jabier Mutton

## 2015-11-03 NOTE — Lactation Note (Addendum)
This note was copied from a baby's chart. Lactation Consultation Note:  Mother has bilateral scabs. She has blood blister on the left nipple. Assist mother with latching infant on the (R) breast in football hold. The initial latch on was a pain scale of #7 for mother. After a few seconds became a #3, infant fed 15 mins. Mother states that pain went away . Infant offered the alternate breast in cross cradle hold. The initial latch was painful and then after infant began suckling with a pattern, pain went away.  Assessed infants oral cavity with a glove finger. Observed that infant chomps down with gums. I think this is what mother is felling when she say its pain on the onset of the latch.  Observed that blister popped on the (L) nipple and small piece of skin removed from nipple. Lots of praise and support given to mother. Encouraged to continue to breastfeed 8-12 times in 24 hours. Suggested to do frequent skin to skin. Mother advised to use hand expresses milk on her nipples and comfort gels given for soreness.   Patient Name: Brittany Copeland [id number] Date: 11/03/2015 Reason for consult: Follow-up assessment   Maternal Data    Feeding Feeding Type: Breast Fed Length of feed: 15 min  LATCH Score/Interventions Latch: Grasps breast easily, tongue down, lips flanged, rhythmical sucking. Intervention(s): Adjust position;Assist with latch;Breast compression  Audible Swallowing: A few with stimulation  Type of Nipple: Everted at rest and after stimulation  Comfort (Breast/Nipple): Filling, red/small blisters or bruises, mild/mod discomfort (infant gums nipple) Problem noted: Cracked, bleeding, blisters, bruises Intervention(s): Expressed breast milk to nipple     Hold (Positioning): Assistance needed to correctly position infant at breast and maintain latch. Intervention(s): Support Pillows;Position options  LATCH Score: 7  Lactation Tools Discussed/Used     Consult  Status Consult Status: Follow-up Date: 11/04/15 Follow-up type: In-patient    Jess Barters [facility] 11/03/2015, 2:32 PM

## 2015-11-03 NOTE — Lactation Note (Signed)
This note was copied from a baby's chart. Lactation Consultation Note  Patient Name: Brittany Copeland [id number] Date: 11/03/2015 Reason for consult: Follow-up assessment   Maternal Data    Feeding Feeding Type: Breast Fed Length of feed: 15 min  LATCH Score/Interventions Latch: Grasps breast easily, tongue down, lips flanged, rhythmical sucking. Intervention(s): Adjust position;Assist with latch;Breast compression  Audible Swallowing: A few with stimulation  Type of Nipple: Everted at rest and after stimulation  Comfort (Breast/Nipple): Filling, red/small blisters or bruises, mild/mod discomfort (infant gums nipple) Problem noted: Cracked, bleeding, blisters, bruises Intervention(s): Expressed breast milk to nipple     Hold (Positioning): Assistance needed to correctly position infant at breast and maintain latch. Intervention(s): Support Pillows;Position options  LATCH Score: 7  Lactation Tools Discussed/Used     Consult Status Consult Status: Follow-up Date: 11/04/15 Follow-up type: In-patient    Jess Barters [facility] 11/03/2015, 2:57 PM

## 2015-11-03 NOTE — Progress Notes (Signed)
Post Partum Day 1 Subjective: up ad lib, voiding, tolerating PO and c/o nipples blistered and bruised  Objective: Blood pressure 122/75, pulse 96, temperature 98.5 F (36.9 C), temperature source Oral, resp. rate 18, height 5\' 9"  (1.753 m), weight 176 lb 12.8 oz (80.2 kg), last menstrual period 01/27/2015, SpO2 98 %, unknown if currently breastfeeding.  Physical Exam:  General: alert and cooperative Lochia: appropriate Uterine Fundus: firm Incision: healing well DVT Evaluation: No evidence of DVT seen on physical exam. Negative Homan's sign. No cords or calf tenderness. No significant calf/ankle edema.   Recent Labs  11/02/15 0650 11/03/15 0552  HGB 11.6* 9.7*  HCT 34.2* 29.2*    Assessment/Plan: Plan for discharge tomorrow   LOS: 1 day   CURTIS,CAROL G 11/03/2015, 8:14 AM

## 2015-11-04 MED ORDER — OXYCODONE-ACETAMINOPHEN 5-325 MG PO TABS: 1.0000 | ORAL_TABLET | ORAL | 0 refills | Status: DC | PRN

## 2015-11-04 MED ORDER — IBUPROFEN 600 MG PO TABS
600.0000 mg | ORAL_TABLET | Freq: Four times a day (QID) | ORAL | 0 refills | Status: DC
Start: 2015-11-04 — End: 2016-11-22

## 2015-11-04 NOTE — Discharge Summary (Signed)
Obstetric Discharge Summary Reason for Admission: rupture of membranes Prenatal Procedures: ultrasound Intrapartum Procedures: vacuum Postpartum Procedures: none Complications-Operative and Postpartum: 2 degree perineal laceration Hemoglobin  Date Value Ref Range Status  11/03/2015 9.7 (L) 12.0 - 15.0 g/dL Final   HCT  Date Value Ref Range Status  11/03/2015 29.2 (L) 36.0 - 46.0 % Final    Physical Exam:  General: alert and cooperative Lochia: appropriate Uterine Fundus: firm Incision: healing well DVT Evaluation: No evidence of DVT seen on physical exam. Negative Homan's sign. No cords or calf tenderness. No significant calf/ankle edema.  Discharge Diagnoses: Term Pregnancy-delivered  Discharge Information: Date: 11/04/2015 Activity: pelvic rest Diet: routine Medications: PNV, Ibuprofen and Percocet Condition: stable Instructions: refer to practice specific booklet Discharge to: home   Newborn Data: Live born female  Birth Weight: 8 lb (3630 g) APGAR: 8, 9  Home with mother.  CURTIS,CAROL G 11/04/2015, 8:29 AM

## 2015-11-04 NOTE — Lactation Note (Signed)
This note was copied from a baby's chart. Lactation Consultation Note  Patient Name: Brittany Copeland [id number] Date: 11/04/2015 Reason for consult: Follow-up assessment;Infant weight loss;Other (Comment);Breast/nipple pain (4% weight loss , at 24 hours bili check - 1.7 , sore nipples )  Baby is 32 hours old and if for D/C. Mom has been breast feeding without NS per mom and early this am per mom felt like the baby wasn't getting enough after cluster feeds of 60 mins each .  So baby was supplemented once with formula. LC reviewed sore nipple and engorgement prevention and tx. Steps for latching when dealing with soreness - LC recommended on the 1st breast -  Breast massage, hand express, pre - pump with hand pump 7-8 strokes to prime the milk ducts and make the nipple / areola compressible for a deeper latch. If there is swelling at the base of the  Nipple reverse pressure, may need to repeat. LC showed mom how to do the reverse pressure.Latch with breast compressions until baby is in a swallowing pattern and then intermittent.  LC highly recommended using the cold comfort gels after feedings until warm and switching to the shells/ when awake or at least 10 mins before feeding.  Mom has shells , hand pump , and her own DEBP personal pump for [facility].  LC encouraged mom to call [facility] office with questions and concerns with breast feeding, consider the BFSG on Monday's at 7 pm or Tuesday's at 11 am. If sore nipples not improved by 4 days to call for  [facility] O/[facility] appt.     Maternal Data Has patient been taught Hand Expression?: Yes  Feeding Feeding Type:  (baby last fed at 0800 ) Length of feed: 20 min  LATCH Score/Interventions                Intervention(s): Breastfeeding basics reviewed (see LC note )     Lactation Tools Discussed/Used Tools: Shells;Pump (per mom not using the NS for latching ) Shell Type: Inverted Breast pump type: Manual WIC Program: No   Consult  Status Consult Status: Complete Date: 11/04/15    Myer Haff 11/04/2015, 10:37 AM

## 2015-11-06 ENCOUNTER — Inpatient Hospital Stay (HOSPITAL_COMMUNITY): Admission: RE | Admit: 2015-11-06 | Payer: BLUE CROSS/BLUE SHIELD | Source: Ambulatory Visit

## 2016-11-22 ENCOUNTER — Encounter: Payer: Self-pay | Admitting: Family Medicine

## 2016-11-22 ENCOUNTER — Ambulatory Visit (INDEPENDENT_AMBULATORY_CARE_PROVIDER_SITE_OTHER): Payer: BLUE CROSS/BLUE SHIELD | Admitting: Family Medicine

## 2016-11-22 VITALS — BP 118/64 | HR 87 | Temp 98.8°F | Ht 67.0 in | Wt 164.2 lb

## 2016-11-22 DIAGNOSIS — B070 Plantar wart: Secondary | ICD-10-CM | POA: Diagnosis not present

## 2016-11-22 NOTE — Progress Notes (Signed)
Subjective:    Patient ID: Brittany Copeland, female    DOB: 05/20/1985, 31 y.o.   MRN: [id number]  HPI Here for pain of the right foot   A few months ago she stepped on a wasp  May have been stung (? It was dead) did not pick out a stinger  Then it went away -not it came back  Can see a little spot   ? If a wart  Hurts to walk on it  She has been trying to keep it clean     Wt Readings from Last 3 Encounters:  11/22/16 164 lb 4 oz (74.5 kg)  11/02/15 176 lb 12.8 oz (80.2 kg)  06/20/15 148 lb (67.1 kg)     Patient Active Problem List   Diagnosis Date Noted  . Plantar wart 11/22/2016  . Premature rupture of membranes 11/02/2015  . Vacuum extractor delivery, delivered 11/02/2015  . Positive pregnancy test 04/08/2015  . 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 Right knee pain 09/11/2013  . 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 Abnormal cervical Pap smear with positive HPV DNA test 04/25/2013  . Celiac disease 04/19/2013  . Routine general medical examination at a health care facility 04/18/2013  . Encounter for routine gynecological examination 04/18/2013  . 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 MIGRAINE HEADACHE 07/20/2006   Past Medical History:  Diagnosis Date  . Celiac disease   . Headache   . Vaginal Pap smear, abnormal    Past Surgical History:  Procedure Laterality Date  . ADENOIDECTOMY     Social History  Substance Use Topics  . Smoking status: Never Smoker  . Smokeless tobacco: Never Used  . Alcohol use No   Family History  Problem Relation Age of Onset  . Heart disease Father   . Hypertension Father   . Seizures Mother   . Heart disease Paternal Uncle   . Diabetes Maternal Grandmother   . Thyroid disease Maternal Grandmother   . Cancer Paternal Grandfather    Allergies  Allergen Reactions  . Pineapple Anaphylaxis  . Augmentin [Amoxicillin-Pot Clavulanate] Nausea And Vomiting and Other (See Comments)    Has patient had a PCN  reaction causing immediate rash, facial/tongue/throat swelling, SOB or lightheadedness with hypotension: No Has patient had a PCN reaction causing severe rash involving mucus membranes or skin necrosis: No Has patient had a PCN reaction that required hospitalization No Has patient had a PCN reaction occurring within the last 10 years: Yes If all of the above answers are "NO", then may proceed with Cephalosporin use.   . Gluten Meal Other (See Comments)    Pt has celiac disease.     No current outpatient prescriptions on file prior to visit.   No current facility-administered medications on file prior to visit.     Review of Systems    Review of Systems  Constitutional: Negative for fever, appetite change, fatigue and unexpected weight change.  Eyes: Negative for pain and visual disturbance.  Respiratory: Negative for cough and shortness of breath.   Cardiovascular: Negative for cp or palpitations    Gastrointestinal: Negative for nausea, diarrhea and constipation.  Genitourinary: Negative for urgency and frequency.  Skin: Negative for pallor or rash  pos for painful wart on foot  Neurological: Negative for weakness, light-headedness, numbness and headaches.  Hematological: Negative for adenopathy. Does not bruise/bleed easily.  Psychiatric/Behavioral: Negative for dysphoric mood. The patient is not nervous/anxious.      Objective:   Physical  Exam  Constitutional: She appears well-developed and well-nourished. No distress.  Well appearing   HENT:  Mouth/Throat: Oropharynx is clear and moist.  Eyes: Pupils are equal, round, and reactive to light. Conjunctivae and EOM are normal.  Cardiovascular: Normal rate and regular rhythm.   Neurological: She is alert.  Skin: Skin is warm and dry.  Plantar wart on R foot just prox to 4th toe  Cleaned and debrided with #15 scalpel  Then cryo tx with freeze and thaw times 3 Pt tolerated well  Aftercare disc   Psychiatric: She has a normal  mood and affect.          Assessment & Plan:   Problem List Items Addressed This Visit      Musculoskeletal and Integument   Plantar wart    Debrided with scalpel /sterile  tx with cryotx times 3 -pt tolerated well Handout given  Soaks/ pumice or foot file Compound W otc or salicylic acid patch prn  F/u 1 mo if not improved

## 2016-11-22 NOTE — Assessment & Plan Note (Signed)
Debrided with scalpel /sterile  tx with cryotx times 3 -pt tolerated well Handout given  Soaks/ pumice or foot file Compound W otc or salicylic acid patch prn  F/u 1 mo if not improved

## 2016-11-22 NOTE — Patient Instructions (Signed)
I debrided your plantar wart and froze it  It will blister and then heal  You can try compund W or a salicylic acid patch over the counter to keep wearing it down  Then soak foot in warm soapy water and work on the dead skin with foot file or pumice stone  It not improved in a month, we can re treat it

## 2017-03-28 ENCOUNTER — Telehealth: Payer: BLUE CROSS/BLUE SHIELD | Admitting: Family

## 2017-03-28 DIAGNOSIS — J019 Acute sinusitis, unspecified: Secondary | ICD-10-CM

## 2017-03-28 MED ORDER — DOXYCYCLINE HYCLATE 100 MG PO TABS: 100.0000 mg | ORAL_TABLET | Freq: Two times a day (BID) | ORAL | 0 refills | Status: DC

## 2017-03-28 NOTE — Progress Notes (Signed)

## 2017-04-20 ENCOUNTER — Telehealth: Payer: BLUE CROSS/BLUE SHIELD | Admitting: Family

## 2017-04-20 DIAGNOSIS — J329 Chronic sinusitis, unspecified: Secondary | ICD-10-CM

## 2017-04-20 DIAGNOSIS — B9789 Other viral agents as the cause of diseases classified elsewhere: Secondary | ICD-10-CM

## 2017-04-20 MED ORDER — FLUTICASONE PROPIONATE 50 MCG/ACT NA SUSP: 2.0000 | Freq: Every day | NASAL | 6 refills | Status: DC

## 2017-04-20 NOTE — Progress Notes (Signed)

## 2017-05-11 ENCOUNTER — Encounter: Payer: Self-pay | Admitting: Family Medicine

## 2017-05-11 ENCOUNTER — Ambulatory Visit: Payer: BLUE CROSS/BLUE SHIELD | Admitting: Family Medicine

## 2017-05-11 VITALS — BP 106/64 | HR 75 | Temp 99.0°F | Ht 67.0 in | Wt 160.2 lb

## 2017-05-11 DIAGNOSIS — J0101 Acute recurrent maxillary sinusitis: Secondary | ICD-10-CM

## 2017-05-11 DIAGNOSIS — J019 Acute sinusitis, unspecified: Secondary | ICD-10-CM | POA: Insufficient documentation

## 2017-05-11 MED ORDER — LEVOFLOXACIN 500 MG PO TABS: 500.0000 mg | ORAL_TABLET | Freq: Every day | ORAL | 0 refills | Status: DC

## 2017-05-11 NOTE — Progress Notes (Signed)
Subjective:    Patient ID: Brittany Copeland, female    DOB: 1985-12-02, 32 y.o.   MRN: [id number]  HPI Here for sinus symptoms including R ear pain    Got sick 12/19 - did e visit and was tx with abx (dx with bacterial sinus infx) and was tx with doxycycline   Never got completely better  Than her whole family got sick   Starting to get worse  Friday  R ear hurts   (L ear is ok)  Eye is "blurry" - and a little grainy feeling    (has tried glasses instead of contacts)  It is uncomfortable  No fever since earlier on with original illness   Nasal- congested - improved and now runny moreso - yellow and bloody d/c  Sinus pain on R side especially (under eye)  Feels like she has been punched in the face  No ST (just irritated)   Cough - a lot/ occ prod /not much Feels like she may have congestion deep in chest   No chance pregnant  Has to stay gluten free   neti pot  zorbee  Zinc Elderberry  Immun[place] from [place]   She took the target brand of mucinex   Patient Active Problem List   Diagnosis Date Noted  . Acute sinusitis 05/11/2017  . Plantar wart 11/22/2016  . Premature rupture of membranes 11/02/2015  . Vacuum extractor delivery, delivered 11/02/2015  . Positive pregnancy test 04/08/2015  . 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 Right knee pain 09/11/2013  . 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 Abnormal cervical Pap smear with positive HPV DNA test 04/25/2013  . Celiac disease 04/19/2013  . Routine general medical examination at a health care facility 04/18/2013  . Encounter for routine gynecological examination 04/18/2013  . 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 MIGRAINE HEADACHE 07/20/2006   Past Medical History:  Diagnosis Date  . Celiac disease   . Headache   . Vaginal Pap smear, abnormal    Past Surgical History:  Procedure Laterality Date  . ADENOIDECTOMY     Social History   Tobacco Use  . Smoking status: Never Smoker  .  Smokeless tobacco: Never Used  Substance Use Topics  . Alcohol use: No    Alcohol/week: 0.0 oz  . Drug use: No   Family History  Problem Relation Age of Onset  . Heart disease Father   . Hypertension Father   . Seizures Mother   . Heart disease Paternal Uncle   . Diabetes Maternal Grandmother   . Thyroid disease Maternal Grandmother   . Cancer Paternal Grandfather    Allergies  Allergen Reactions  . Pineapple Anaphylaxis  . Augmentin [Amoxicillin-Pot Clavulanate] Nausea And Vomiting and Other (See Comments)    Has patient had a PCN reaction causing immediate rash, facial/tongue/throat swelling, SOB or lightheadedness with hypotension: No Has patient had a PCN reaction causing severe rash involving mucus membranes or skin necrosis: No Has patient had a PCN reaction that required hospitalization No Has patient had a PCN reaction occurring within the last 10 years: Yes If all of the above answers are "NO", then may proceed with Cephalosporin use.   Asencion Islam [Fluticasone Propionate]     Made pt feel flushed and face turned red  . Gluten Meal Other (See Comments)    Pt has celiac disease.     Current Outpatient Medications on File Prior to Visit  Medication Sig Dispense Refill  . Biotin w/  Vitamins C & E (HAIR/SKIN/NAILS PO) Take by mouth.    . Cyanocobalamin (VITAMIN B-12 PO) Take by mouth.    . Diethylpropion HCl CR 75 MG TB24 Take 1 tablet by mouth every morning.  3  . Multiple Vitamin (MULTIVITAMIN) capsule Take 1 capsule by mouth daily.    Marland Kitchen ALPRAZolam (XANAX) 0.25 MG tablet Take 0.25 mg by mouth 2 (two) times daily as needed for anxiety.    . cyclobenzaprine (FLEXERIL) 10 MG tablet Take 10 mg by mouth 3 (three) times daily as needed for muscle spasms.    . pantoprazole (PROTONIX) 40 MG tablet Take 40 mg by mouth daily as needed.     No current facility-administered medications on file prior to visit.     Review of Systems  Constitutional: Positive for appetite change.  Negative for fatigue and fever.  HENT: Positive for congestion, ear pain, postnasal drip, rhinorrhea, sinus pressure and sore throat. Negative for nosebleeds.   Eyes: Negative for pain, redness and itching.  Respiratory: Positive for cough. Negative for shortness of breath and wheezing.   Cardiovascular: Negative for chest pain.  Gastrointestinal: Negative for abdominal pain, diarrhea, nausea and vomiting.  Endocrine: Negative for polyuria.  Genitourinary: Negative for dysuria, frequency and urgency.  Musculoskeletal: Negative for arthralgias and myalgias.  Allergic/Immunologic: Negative for immunocompromised state.  Neurological: Positive for headaches. Negative for dizziness, tremors, syncope, weakness and numbness.  Hematological: Negative for adenopathy. Does not bruise/bleed easily.  Psychiatric/Behavioral: Negative for dysphoric mood. The patient is not nervous/anxious.        Objective:   Physical Exam  Constitutional: She appears well-developed and well-nourished. No distress.  Well appearing   HENT:  Head: Normocephalic and atraumatic.  Right Ear: External ear normal.  Left Ear: External ear normal.  Mouth/Throat: Oropharynx is clear and moist. No oropharyngeal exudate.  Nares are injected and congested  Bilateral maxillary sinus tenderness (much worse on the R)  Without facial swelling or skin change TMs are cull  Post nasal drip   Eyes: Conjunctivae and EOM are normal. Pupils are equal, round, and reactive to light. Right eye exhibits no discharge. Left eye exhibits no discharge.  Neck: Normal range of motion. Neck supple.  Cardiovascular: Normal rate and regular rhythm.  Pulmonary/Chest: Effort normal and breath sounds normal. No respiratory distress. She has no wheezes. She has no rales.  Good air exch  No rales or rhonci  Lymphadenopathy:    She has no cervical adenopathy.  Neurological: She is alert. No cranial nerve deficit.  Skin: Skin is warm and dry. No rash  noted.  Psychiatric: She has a normal mood and affect.          Assessment & Plan:   Problem List Items Addressed This Visit      Respiratory   Acute sinusitis    R maxillary-this may be recurrent from a prev infx  Cover with levaquin in light of drug allergy and prev tx Disc symptomatic care - see instructions on AVS  Steam/saline/warm compresses  Update if not starting to improve in a week or if worsening    Meds ordered this encounter  Medications  . levofloxacin (LEVAQUIN) 500 MG tablet    Sig: Take 1 tablet (500 mg total) by mouth daily. With food    Dispense:  10 tablet    Refill:  0         Relevant Medications   levofloxacin (LEVAQUIN) 500 MG tablet

## 2017-05-11 NOTE — Patient Instructions (Signed)
Take the levaquin as directed  Drink lots of fluids Nasal saline spray is helpful  mucinex as directed for congestion   Ibuprofen for congestion/ swelling   Warm compress on sinuses  Breathe steam   Update if not starting to improve in a week or if worsening

## 2017-05-12 NOTE — Assessment & Plan Note (Signed)
R maxillary-this may be recurrent from a prev infx  Cover with levaquin in light of drug allergy and prev tx Disc symptomatic care - see instructions on AVS  Steam/saline/warm compresses  Update if not starting to improve in a week or if worsening    Meds ordered this encounter  Medications  . levofloxacin (LEVAQUIN) 500 MG tablet    Sig: Take 1 tablet (500 mg total) by mouth daily. With food    Dispense:  10 tablet    Refill:  0

## 2017-05-19 ENCOUNTER — Telehealth: Payer: Self-pay | Admitting: Family Medicine

## 2017-05-19 MED ORDER — LEVOFLOXACIN 500 MG PO TABS: 500.0000 mg | ORAL_TABLET | Freq: Every day | ORAL | 0 refills | Status: DC

## 2017-05-19 NOTE — Telephone Encounter (Signed)
Liz notified as instructed by telephone.  Levaquin sent to [facility] in [facility] as instructed by Dr. Glori Bickers.

## 2017-05-19 NOTE — Telephone Encounter (Signed)
Copied from [place]. Topic: Quick Communication - See Telephone Encounter >> May 19, 2017 12:52 PM Ivar Drape wrote: CRM for notification. See Telephone encounter for:  05/19/17. Patient was in to see her provider on 05/11/17 and was told if she didn't feel any better to let the provider know.  She is feeling better but she is not 100%.  She still has burning on the right side of her face around her eye area, but the eye is better.  She has two days left of the antibiotic she was prescribed.

## 2017-05-19 NOTE — Telephone Encounter (Signed)
Please extend her levaquin 500 mg daily for 5 more days for sinus infection  Glad she is doing some better  Keep me posted- if symptoms are not resolved with this please alert me

## 2017-06-02 ENCOUNTER — Encounter: Payer: Self-pay | Admitting: Internal Medicine

## 2017-06-02 ENCOUNTER — Ambulatory Visit: Payer: BLUE CROSS/BLUE SHIELD | Admitting: Internal Medicine

## 2017-06-02 VITALS — BP 126/88 | HR 103 | Temp 98.7°F | Wt 164.2 lb

## 2017-06-02 DIAGNOSIS — J0141 Acute recurrent pansinusitis: Secondary | ICD-10-CM | POA: Diagnosis not present

## 2017-06-02 MED ORDER — CLINDAMYCIN HCL 300 MG PO CAPS: 300.0000 mg | ORAL_CAPSULE | Freq: Three times a day (TID) | ORAL | 0 refills | Status: DC

## 2017-06-02 MED ORDER — MOMETASONE FUROATE 50 MCG/ACT NA SUSP: 2.0000 | Freq: Every day | NASAL | 12 refills | Status: DC

## 2017-06-02 NOTE — Patient Instructions (Signed)
Please try the mometasone nasal spray. Fill the antibiotic (clindamycin) only if you are clearly worsening.

## 2017-06-02 NOTE — Progress Notes (Signed)
Subjective:    Patient ID: Brittany Copeland, female    DOB: [birth year], 32 y.o.   MRN: [id number]  HPI Here due to recurrent sinus symptoms  Felt better after last month's visit but not over it completely Doesn't fel awful--but still having pressure--bad yesterday in store when she bent down No fever Some cough and sneezing Slight drainage--coughs is Headache yesterday--ibuprofen helped Using mucinex as well  Simply saline, facial steamer Neti pot--no discharge lately Had reaction to flonase--so not recurring  Has some tree allergies--doesn't use antihistamines though  Current Outpatient Medications on File Prior to Visit  Medication Sig Dispense Refill  . Biotin w/ Vitamins C & E (HAIR/SKIN/NAILS PO) Take by mouth.    . Cyanocobalamin (VITAMIN B-12 PO) Take by mouth.    . Diethylpropion HCl CR 75 MG TB24 Take 1 tablet by mouth every morning.  3  . Multiple Vitamin (MULTIVITAMIN) capsule Take 1 capsule by mouth daily.     No current facility-administered medications on file prior to visit.     Allergies  Allergen Reactions  . Pineapple Anaphylaxis  . Augmentin [Amoxicillin-Pot Clavulanate] Nausea And Vomiting and Other (See Comments)    Has patient had a PCN reaction causing immediate rash, facial/tongue/throat swelling, SOB or lightheadedness with hypotension: No Has patient had a PCN reaction causing severe rash involving mucus membranes or skin necrosis: No Has patient had a PCN reaction that required hospitalization No Has patient had a PCN reaction occurring within the last 10 years: Yes If all of the above answers are "NO", then may proceed with Cephalosporin use.   Asencion Islam [Fluticasone Propionate]     Made pt feel flushed and face turned red  . Gluten Meal Other (See Comments)    Pt has celiac disease.      Past Medical History:  Diagnosis Date  . Celiac disease   . Headache   . Vaginal Pap smear, abnormal     Past Surgical History:  Procedure  Laterality Date  . ADENOIDECTOMY      Family History  Problem Relation Age of Onset  . Heart disease Father   . Hypertension Father   . Seizures Mother   . Heart disease Paternal Uncle   . Diabetes Maternal Grandmother   . Thyroid disease Maternal Grandmother   . Cancer Paternal Grandfather     Social History   Socioeconomic History  . Marital status: Divorced    Spouse name: Not on file  . Number of children: Not on file  . Years of education: Not on file  . Highest education level: Not on file  Social Needs  . Financial resource strain: Not on file  . Food insecurity - worry: Not on file  . Food insecurity - inability: Not on file  . Transportation needs - medical: Not on file  . Transportation needs - non-medical: Not on file  Occupational History  . Not on file  Tobacco Use  . Smoking status: Never Smoker  . Smokeless tobacco: Never Used  Substance and Sexual Activity  . Alcohol use: No    Alcohol/week: 0.0 oz  . Drug use: No  . Sexual activity: Not on file  Other Topics Concern  . Not on file  Social History Narrative  . Not on file   Review of Systems No rash Some right ear/jaw pain Appetite is okay No vomiting or diarrhea Electric heat--humidifier    Objective:   Physical Exam  HENT:  Mouth/Throat: Oropharynx is clear and moist. No  oropharyngeal exudate.  Mild right frontal and maxillary tenderness Marked nasal swelling--can't tell if polyp  Neck: No thyromegaly present.  Pulmonary/Chest: Effort normal and breath sounds normal. No respiratory distress. She has no wheezes. She has no rales.  Lymphadenopathy:    She has no cervical adenopathy.          Assessment & Plan:

## 2017-06-02 NOTE — Assessment & Plan Note (Signed)
Not clear if she has true bacterial infection though Reaction to flonase--will try mometasone Rx for clinda--in case worsens May need ENT referral

## 2017-09-16 NOTE — Telephone Encounter (Signed)
Preadmission screen  

## 2019-02-01 ENCOUNTER — Telehealth: Payer: BLUE CROSS/BLUE SHIELD | Admitting: Physician Assistant

## 2019-02-01 DIAGNOSIS — J0110 Acute frontal sinusitis, unspecified: Secondary | ICD-10-CM | POA: Diagnosis not present

## 2019-02-01 MED ORDER — LEVOFLOXACIN 500 MG PO TABS: 500.0000 mg | ORAL_TABLET | Freq: Every day | ORAL | 0 refills | Status: DC

## 2019-02-01 NOTE — Progress Notes (Signed)
We are sorry that you are not feeling well.  Here is how we plan to help!  Based on what you have shared with me it looks like you have sinusitis.  Sinusitis is inflammation and infection in the sinus cavities of the head.  Based on your presentation I believe you most likely have Acute Bacterial Sinusitis.  This is an infection caused by bacteria and is treated with antibiotics. I have prescribed Levofloxicin 500mg  by mouth once daily for 7 days. You may use an oral decongestant such as Mucinex D or if you have glaucoma or high blood pressure use plain Mucinex. Saline nasal spray help and can safely be used as often as needed for congestion.  If you develop worsening sinus pain, fever or notice severe headache and vision changes, or if symptoms are not better after completion of antibiotic, please schedule an appointment with a health care provider.    Sinus infections are not as easily transmitted as other respiratory infection, however we still recommend that you avoid close contact with loved ones, especially the very young and elderly.  Remember to wash your hands thoroughly throughout the day as this is the number one way to prevent the spread of infection!  Home Care:  Only take medications as instructed by your medical team.  Complete the entire course of an antibiotic.  Do not take these medications with alcohol.  A steam or ultrasonic humidifier can help congestion.  You can place a towel over your head and breathe in the steam from hot water coming from a faucet.  Avoid close contacts especially the very young and the elderly.  Cover your mouth when you cough or sneeze.  Always remember to wash your hands.  Get Help Right Away If:  You develop worsening fever or sinus pain.  You develop a severe head ache or visual changes.  Your symptoms persist after you have completed your treatment plan.  Make sure you  Understand these instructions.  Will watch your  condition.  Will get help right away if you are not doing well or get worse.  Your e-visit answers were reviewed by a board certified advanced clinical practitioner to complete your personal care plan.  Depending on the condition, your plan could have included both over the counter or prescription medications.  If there is a problem please reply  once you have received a response from your provider.  Your safety is important to [place].  If you have drug allergies check your prescription carefully.    You can use MyChart to ask questions about today's visit, request a non-urgent call back, or ask for a work or school excuse for 24 hours related to this e-Visit. If it has been greater than 24 hours you will need to follow up with your provider, or enter a new e-Visit to address those concerns.  You will get an e-mail in the next two days asking about your experience.  I hope that your e-visit has been valuable and will speed your recovery. Thank you for using e-visits.  [place] than 5 minutes, yet less than 10 minutes of time have been spent researching, coordinating, and implementing care for this patient today.   Thank you for the details you included in the comment boxes. Those details are very helpful in determining the best course of treatment for you and help [place] to provide the best care.

## 2019-07-26 ENCOUNTER — Encounter: Payer: Self-pay | Admitting: Family Medicine

## 2019-07-26 ENCOUNTER — Telehealth (INDEPENDENT_AMBULATORY_CARE_PROVIDER_SITE_OTHER): Payer: 59 | Admitting: Family Medicine

## 2019-07-26 DIAGNOSIS — J0101 Acute recurrent maxillary sinusitis: Secondary | ICD-10-CM

## 2019-07-26 MED ORDER — DOXYCYCLINE HYCLATE 100 MG PO TABS: 100.0000 mg | ORAL_TABLET | Freq: Two times a day (BID) | ORAL | 0 refills | Status: DC

## 2019-07-26 NOTE — Patient Instructions (Signed)
I sent in doxycycline for a sinus infection  Drink fluids  Nasal saline is ok to try / or netti pot Avoid nasonex if nostrils are irritated-until improved   Ibuprofen is ok for pain/sinus pressure  If you develop fever or any other new symptoms please let us know (in that case I would recommend covid testing)   Update if not starting to improve in a week or if worsening

## 2019-07-26 NOTE — Assessment & Plan Note (Signed)
R maxillary- after a bout of congestion from allergies  Tender face with colored nasal d/c  Px doxycycline (pt cannot take augmentin) Nasal saline rinse and warm compresses encouraged  Ibuprofen prn  If symptoms persist or change, consider covid testing  Update if not starting to improve in a week or if worsening

## 2019-07-26 NOTE — Progress Notes (Signed)
Virtual Visit via Video Note  I connected with Brittany Copeland on 07/26/19 at 10:45 AM EDT by a video enabled telemedicine application and verified that I am speaking with the correct person using two identifiers.  Location: Patient: home Provider: office    I discussed the limitations of evaluation and management by telemedicine and the availability of in person appointments. The patient expressed understanding and agreed to proceed.  Parties involved in encounter  [place]  Provider:  Loura Pardon MD   History of Present Illness: Pt presents with sinus symptoms   She has a h/o recurrent sinusitis   Congestion /sinus pressure for a mo   A week ago- felt worse  R sided facial pain   (R ear hurts-not severe)  Throat is scratchy  Had a little blood from ear once  No facial swelling  Exhausted   Some pnd  Some sneezing  Runny nose / was more congested before - clear to yellow now   Had some chills/ no fever   No change in taste or smell  Deep dry cough - scant phlegm yellow if any   Ibuprofen otc  Was using nasacort-but it has irritated inside of nostrils   No covid contacts  Not around anybody   Patient Active Problem List   Diagnosis Date Noted  . 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 Acute sinusitis 05/11/2017  . Plantar wart 11/22/2016  . Premature rupture of membranes 11/02/2015  . Vacuum extractor delivery, delivered 11/02/2015  . Positive pregnancy test 04/08/2015  . 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 Right knee pain 09/11/2013  . 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 Abnormal cervical Pap smear with positive HPV DNA test 04/25/2013  . Celiac disease 04/19/2013  . Routine general medical examination at a health care facility 04/18/2013  . Encounter for routine gynecological examination 04/18/2013  . 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 MIGRAINE HEADACHE 07/20/2006   Past Medical History:  Diagnosis Date  . Celiac  disease   . Headache   . Vaginal Pap smear, abnormal    Past Surgical History:  Procedure Laterality Date  . ADENOIDECTOMY     Social History   Tobacco Use  . Smoking status: Never Smoker  . Smokeless tobacco: Never Used  Substance Use Topics  . Alcohol use: No    Alcohol/week: 0.0 standard drinks  . Drug use: No   Family History  Problem Relation Age of Onset  . Heart disease Father   . Hypertension Father   . Seizures Mother   . Heart disease Paternal Uncle   . Diabetes Maternal Grandmother   . Thyroid disease Maternal Grandmother   . Cancer Paternal Grandfather    Allergies  Allergen Reactions  . Pineapple Anaphylaxis  . Augmentin [Amoxicillin-Pot Clavulanate] Nausea And Vomiting and Other (See Comments)    Has patient had a PCN reaction causing immediate rash, facial/tongue/throat swelling, SOB or lightheadedness with hypotension: No Has patient had a PCN reaction causing severe rash involving mucus membranes or skin necrosis: No Has patient had a PCN reaction that required hospitalization No Has patient had a PCN reaction occurring within the last 10 years: Yes If all of the above answers are "NO", then may proceed with Cephalosporin use.   Asencion Islam [Fluticasone Propionate]     Made pt feel flushed and face turned red  . Gluten Meal Other (See Comments)    Pt has celiac disease.     Current Outpatient Medications on File Prior to  Visit  Medication Sig Dispense Refill  . Biotin w/ Vitamins C & E (HAIR/SKIN/NAILS PO) Take by mouth.    . Cyanocobalamin (VITAMIN B-12 PO) Take by mouth.    . Diethylpropion HCl CR 75 MG TB24 Take 1 tablet by mouth every morning.  3  . mometasone (NASONEX) 50 MCG/ACT nasal spray Place 2 sprays into the nose daily. 17 g 12  . Multiple Vitamin (MULTIVITAMIN) capsule Take 1 capsule by mouth daily.     No current facility-administered medications on file prior to visit.     Review of Systems  Constitutional: Positive for chills.  Negative for fever and malaise/fatigue.  HENT: Positive for ear discharge, ear pain, nosebleeds and sinus pain. Negative for congestion and sore throat.   Eyes: Negative for blurred vision, discharge and redness.  Respiratory: Negative for cough, shortness of breath and stridor.   Cardiovascular: Negative for chest pain, palpitations and leg swelling.  Gastrointestinal: Negative for abdominal pain, diarrhea, nausea and vomiting.  Musculoskeletal: Negative for myalgias.  Skin: Negative for rash.  Neurological: Negative for dizziness and headaches.    Observations/Objective: Patient appears well, in no distress Weight is baseline  No facial swelling or asymmetry, she has marked R sided maxillary tenderness on palp (herself)  Normal voice-not hoarse and no slurred speech No obvious tremor or mobility impairment Moving neck and UEs normally Able to hear the call well  No cough or shortness of breath during interview  Talkative and mentally sharp with no cognitive changes No skin changes on face or neck , no rash or pallor Affect is normal    Assessment and Plan: Problem List Items Addressed This Visit      Respiratory   Acute sinusitis    R maxillary- after a bout of congestion from allergies  Tender face with colored nasal d/c  Px doxycycline (pt cannot take augmentin) Nasal saline rinse and warm compresses encouraged  Ibuprofen prn  If symptoms persist or change, consider covid testing  Update if not starting to improve in a week or if worsening        Relevant Medications   doxycycline (VIBRA-TABS) 100 MG tablet       Follow Up Instructions: I sent in doxycycline for a sinus infection  Drink fluids  Nasal saline is ok to try / or netti pot Avoid nasonex if nostrils are irritated-until improved   Ibuprofen is ok for pain/sinus pressure  If you develop fever or any other new symptoms please let us know (in that case I would recommend covid testing)   Update if not  starting to improve in a week or if worsening    I discussed the assessment and treatment plan with the patient. The patient was provided an opportunity to ask questions and all were answered. The patient agreed with the plan and demonstrated an understanding of the instructions.   The patient was advised to call back or seek an in-person evaluation if the symptoms worsen or if the condition fails to improve as anticipated.     Loura Pardon, MD

## 2019-08-27 ENCOUNTER — Encounter: Payer: Self-pay | Admitting: Family Medicine

## 2021-07-07 ENCOUNTER — Ambulatory Visit: Payer: Medicaid Other | Admitting: Family Medicine

## 2021-07-07 ENCOUNTER — Encounter: Payer: Self-pay | Admitting: Family Medicine

## 2021-07-07 DIAGNOSIS — N649 Disorder of breast, unspecified: Secondary | ICD-10-CM | POA: Diagnosis not present

## 2021-07-07 NOTE — Progress Notes (Signed)
? ?Subjective:  ? ? Patient ID: Brittany Copeland, female    DOB: 1986/01/12, 36 y.o.   MRN: [id number] ? ?HPI ?Pt presents for breast lump  ? ?Wt Readings from Last 3 Encounters:  ?07/07/21 169 lb (76.7 kg)  ?06/02/17 164 lb 4 oz (74.5 kg)  ?05/11/17 160 lb 4 oz (72.7 kg)  ? ?26.27 kg/m? ? ?Has a black knot on her L nipple ?Now several/ also yellow ?Not painful  ? ?Has a full sensation/tingling  ?A few sharp pains that happened for just a second  ?No nipple discharge  ? ?No chance pregnant  ?No menses for 8 years  ? ?No family h/o breast problems  ? ?Mirena iud  ?Will need a new gyn (change ins to [facility] in 2 mo) ?Has seen dr Helane Rima in the past  ?Can't re est until she gets her new card  ? ? ?Has a gyn  ?One child 65 yo  ?Wt gain and feeling like hormones are off/really tired  ? ?Patient Active Problem List  ? Diagnosis Date Noted  ? Nipple lesion 07/07/2021  ? 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 Acute sinusitis 05/11/2017  ? Plantar wart 11/22/2016  ? Premature rupture of membranes 11/02/2015  ? Vacuum extractor delivery, delivered 11/02/2015  ? Positive pregnancy test 04/08/2015  ? 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 Right knee pain 09/11/2013  ? 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 Abnormal cervical Pap smear with positive HPV DNA test 04/25/2013  ? Celiac disease 04/19/2013  ? Routine general medical examination at a health care facility 04/18/2013  ? Encounter for routine gynecological examination 04/18/2013  ? 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 MIGRAINE HEADACHE 07/20/2006  ? ?Past Medical History:  ?Diagnosis Date  ? Celiac disease   ? Headache   ? Vaginal Pap smear, abnormal   ? ?Past Surgical History:  ?Procedure Laterality Date  ? ADENOIDECTOMY    ? ?Social History  ? ?Tobacco Use  ? Smoking status: Never  ? Smokeless tobacco: Never  ?Substance Use Topics  ? Alcohol use: No  ?  Alcohol/week: 0.0 standard drinks  ? Drug use: No  ? ?Family History  ?Problem Relation Age of Onset  ? Heart  disease Father   ? Hypertension Father   ? Seizures Mother   ? Heart disease Paternal Uncle   ? Diabetes Maternal Grandmother   ? Thyroid disease Maternal Grandmother   ? Cancer Paternal Grandfather   ? ?Allergies  ?Allergen Reactions  ? Pineapple Anaphylaxis  ? Augmentin [Amoxicillin-Pot Clavulanate] Nausea And Vomiting and Other (See Comments)  ?  Has patient had a PCN reaction causing immediate rash, facial/tongue/throat swelling, SOB or lightheadedness with hypotension: No ?Has patient had a PCN reaction causing severe rash involving mucus membranes or skin necrosis: No ?Has patient had a PCN reaction that required hospitalization No ?Has patient had a PCN reaction occurring within the last 10 years: Yes ?If all of the above answers are "NO", then may proceed with Cephalosporin use. ?  ? Flonase [Fluticasone Propionate]   ?  Made pt feel flushed and face turned red  ? Gluten Meal Other (See Comments)  ?  Pt has celiac disease.    ? ?Current Outpatient Medications on File Prior to Visit  ?Medication Sig Dispense Refill  ? ADVAIR DISKUS 250-50 MCG/ACT AEPB Inhale 1 puff into the lungs 2 (two) times daily as needed.    ? b complex vitamins capsule Take 1 capsule by mouth daily.    ?  Biotin w/ Vitamins C & E (HAIR/SKIN/NAILS PO) Take by mouth.    ? buPROPion (WELLBUTRIN SR) 150 MG 12 hr tablet Take 1 tablet by mouth in the morning and at bedtime.    ? Multiple Vitamin (MULTIVITAMIN) capsule Take 1 capsule by mouth daily.    ? Probiotic Product (PROBIOTIC DAILY PO) Take 2 tablets by mouth 2 (two) times a week.    ? ?No current facility-administered medications on file prior to visit.  ?  ?Review of Systems  ?Constitutional:  Negative for activity change, appetite change, fatigue, fever and unexpected weight change.  ?HENT:  Negative for congestion, ear pain, rhinorrhea, sinus pressure and sore throat.   ?Eyes:  Negative for pain, redness and visual disturbance.  ?Respiratory:  Negative for cough, shortness of breath  and wheezing.   ?Cardiovascular:  Negative for chest pain and palpitations.  ?Gastrointestinal:  Negative for abdominal pain, blood in stool, constipation and diarrhea.  ?Endocrine: Negative for polydipsia and polyuria.  ?Genitourinary:  Negative for dysuria, frequency and urgency.  ?     Bumps on L nipple  ?Musculoskeletal:  Negative for arthralgias, back pain and myalgias.  ?Skin:  Negative for pallor and rash.  ?Allergic/Immunologic: Negative for environmental allergies.  ?Neurological:  Negative for dizziness, syncope and headaches.  ?Hematological:  Negative for adenopathy. Does not bruise/bleed easily.  ?Psychiatric/Behavioral:  Negative for decreased concentration and dysphoric mood. The patient is not nervous/anxious.   ? ?   ?Objective:  ? Physical Exam ?Constitutional:   ?   General: She is not in acute distress. ?   Appearance: Normal appearance. She is normal weight. She is not ill-appearing.  ?Cardiovascular:  ?   Rate and Rhythm: Normal rate and regular rhythm.  ?Genitourinary: ?   Comments: Several bumps on areola close to nipple, yellow and 2-3 mm in diameter ?One - more lateral appears blue and vascular  ?No tenderness or nipple discharge  ? ? No mass, nodules, thickening, tenderness, bulging, retraction, inflamation, nipple discharge or skin changes noted.  No axillary or clavicular LA.     ? ? ?Musculoskeletal:  ?   Cervical back: Normal range of motion. No tenderness.  ?Lymphadenopathy:  ?   Cervical: No cervical adenopathy.  ?Skin: ?   General: Skin is warm and dry.  ?   Findings: No erythema or rash.  ?Neurological:  ?   Mental Status: She is alert.  ?Psychiatric:     ?   Mood and Affect: Mood normal.  ? ? ? ? ? ?   ?Assessment & Plan:  ? ?Problem List Items Addressed This Visit   ? ?  ? Other  ? Nipple lesion  ?  2 yellow and one blue/vascular appearing lumps on L nipple in area of ducts  ?Suspect montgomery glands , however the blue area is more atypical  ?No lumps on exam ?Pt feels like her  hormones have been changing lately (has IUD so no menses to track)  ?inst to keep areas clean and free of trauma  ?Wear supportive bra  ?diag mammogram ordered today  ? ?  ?  ? Relevant Orders  ? MM Digital Diagnostic Bilat  ? ? ?

## 2021-07-07 NOTE — Patient Instructions (Signed)
Watch for redness/swelling or discomfort in the nipple or breast  ?We will watch the areas on your nipple  ?Keep clean with soap and water  ? ?I ordered a mammogram at the breast center of [facility]  ? ?Please call the location of your choice from the menu below to schedule your Mammogram and/or Bone Density appointment.   ? ?[place]  ? ?[facility]                ?      Phone:  [phone] ?[address]                               ?[place], [place] [address]                                                             ?Services: Traditional and 3D Mammogram, Bone Density  ? ?[facility]           ?      Phone: [phone] ?[address]                                                       ?[place], [place] [address]    ?Service: Bone Density ONLY  ? *this site does NOT perform mammograms ? ?[place]                       ? Phone:  [phone] ?[address]                                  ?[place], [place] [address]                                            ?Services:  3D Mammogram and Bone Density  ? ? ?[place] ? ?[place] [facility]   ?Phone:  [phone]   ?[place],  [address]                                            ?Services: 3D Mammogram and Bone Density ? ?[place] [facility])  ?Phone:  [phone]   ?[address]                        ?  [place], [place] [address]                                              ?Services:  3D Mammogram and Bone Density ? ?

## 2021-07-07 NOTE — Assessment & Plan Note (Addendum)
2 yellow and one blue/vascular appearing lumps on L nipple in area of ducts  ?Suspect montgomery glands , however the blue area is more atypical  ?No lumps on exam ?Pt feels like her hormones have been changing lately (has IUD so no menses to track)  ?inst to keep areas clean and free of trauma  ?Wear supportive bra  ?diag mammogram ordered today  ? ?

## 2021-07-08 ENCOUNTER — Other Ambulatory Visit: Payer: Self-pay | Admitting: Family Medicine

## 2021-07-08 DIAGNOSIS — N649 Disorder of breast, unspecified: Secondary | ICD-10-CM

## 2021-08-05 ENCOUNTER — Encounter: Payer: Self-pay | Admitting: Family Medicine

## 2021-08-05 ENCOUNTER — Ambulatory Visit
Admission: RE | Admit: 2021-08-05 | Discharge: 2021-08-05 | Disposition: A | Discharge status: Home / Self Care | Payer: Medicaid Other | Source: Ambulatory Visit | Attending: Family Medicine | Admitting: Family Medicine

## 2021-08-05 DIAGNOSIS — N6489 Other specified disorders of breast: Secondary | ICD-10-CM | POA: Diagnosis not present

## 2021-08-05 DIAGNOSIS — N649 Disorder of breast, unspecified: Secondary | ICD-10-CM

## 2021-08-05 DIAGNOSIS — R922 Inconclusive mammogram: Secondary | ICD-10-CM | POA: Diagnosis not present

## 2021-08-10 ENCOUNTER — Telehealth: Payer: Self-pay

## 2021-08-10 NOTE — Telephone Encounter (Signed)
Called vm for patient to call us regarding lab results. ?

## 2021-08-10 NOTE — Telephone Encounter (Signed)
-----   Message from Abner Greenspan, MD sent at 08/05/2021  7:24 PM EDT ----- ?Reassuring report for breast imaging ?I want to refer you to dermatology to check out the lesions  ?Do you have someone you like or prefer a practice ?  ?

## 2021-09-16 ENCOUNTER — Encounter: Payer: Self-pay | Admitting: Family Medicine

## 2021-09-16 ENCOUNTER — Telehealth: Payer: Medicaid Other | Admitting: Family Medicine

## 2021-09-16 DIAGNOSIS — F329 Major depressive disorder, single episode, unspecified: Secondary | ICD-10-CM

## 2021-09-16 NOTE — Patient Instructions (Addendum)
Call the [facility] center to acquire about options for counseling and psychiatry   [facility] [address] [place], [place] [address]  [phone]  They also take walk ins for emergencies   I will consult social work to see if they can offer help also   Go up on your wellbutrin SR to 300 mg twice daily  If side effects or if you feel worse (more depressed or anxious) then call us   Continue to exercise and talk to supportive people  If at any point you feel you could harm yourself please go to the closest ER
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
## 2021-09-18 ENCOUNTER — Other Ambulatory Visit: Payer: Self-pay | Admitting: Licensed Clinical Social Worker

## 2021-09-18 DIAGNOSIS — F329 Major depressive disorder, single episode, unspecified: Secondary | ICD-10-CM

## 2021-09-21 NOTE — Patient Instructions (Signed)
Visit Information  Ms. Kellett was given information about Medicaid Managed Care team care coordination services as a part of their [employer] Medicaid benefit. Delanda Hisaw verbally consented to engagement with the Medicaid Managed Care team.   If you are experiencing a medical emergency, please call 911 or report to your local emergency department or urgent care.   If you have a non-emergency medical problem during routine business hours, please contact your provider's office and ask to speak with a nurse.   For questions related to your [employer] Medicaid health plan, please call: [phone] or visit the homepage here: [email or web address]  If you would like to schedule transportation through your [employer] Medicaid plan, please call the following number at least 2 days in advance of your appointment: [phone]  For information about your ride after you set it up, call Ride Assist at [phone]. Use this number to activate a Will Call pickup, or if your transportation is late for a scheduled pickup. Use this number, too, if you need to make a change or cancel a previously scheduled reservation.  If you need transportation services right away, call [phone]. The after-hours call center is staffed 24 hours to handle ride assistance and urgent reservation requests (including discharges) 365 days a year. Urgent trips include sick visits, hospital discharge requests and life-sustaining treatment.  Call the Behavioral Health Crisis Line at [phone], at any time, 24 hours a day, 7 days a week. If you are in danger or need immediate medical attention call 911.  If you would like help to quit smoking, call 1-800-QUIT-NOW (1-800-784-8669) OR Espaol: 1-855-Djelo-Ya (1-855-335-3569) o para ms informacin haga clic aqu or Text READY to 200-400 to register via text   Following is a copy of your plan of care:  Care Plan : LCSW Plan of Care   Updates made by Joyce, Brooke L, LCSW since 09/21/2021 12:00 AM     Problem: Depression Identification (Depression)      Long-Range Goal: Depressive Symptoms Identified   Start Date: 09/18/2021  This Visit's Progress: On track  Note:   Priority: High  Timeframe:  Long-Range Goal Priority:  High Start Date:   09/18/21              Expected End Date:  ongoing                    Follow Up Date--09/18/21 at 2:30 pm  - keep 90 percent of scheduled appointments -consider counseling or psychiatry -consider bumping up your self-care  -consider creating a stronger support network   Why is this important?             Beating depression may take some time.            If you don't feel better right away, don't give up on your treatment plan.    Current barriers:   Chronic Mental Health needs related to depression, stress and anxiety. Patient requires Support, Education, Resources, Referrals, Advocacy, and Care Coordination, in order to meet Unmet Mental Health Needs. Patient will implement clinical interventions discussed today to decrease symptoms of depression and increase knowledge and/or ability of: coping skills. Mental Health Concerns and Social Isolation Patient lacks knowledge of available community counseling agencies and resources.  Clinical Goal(s): verbalize understanding of plan for management of Anxiety, Depression, and Stress and demonstrate a reduction in symptoms. Patient will connect with a provider for ongoing mental health treatment, increase coping skills, healthy habits, self-management skills, and stress   reduction        Clinical Interventions:  Assessed patient's previous and current treatment, coping skills, support system and barriers to care. Patient provided hx. Patient reports that her ex significant other does not reside with her and that it is only her and her daughter in the home. Patient confirms that she is safe. Safety plan discussed.  Verbalization of  feelings encouraged, motivational interviewing employed Emotional support provided, positive coping strategies explored Self care/establishing healthy boundaries emphasized Patient was educated on available mental health resources within their area that accept Medicaid and offer counseling and psychiatry. Patient is already taking a psychotropic medication for depression and does not wish to pursue psychiatry at this time. She reports that her depression increased due to being in an unhealthy relationship. She is no longer in that relationship.  Patient reports that his medication for depression is no longer working and he needs help with finding psychiatry and counseling. He was successful in identifying triggers to anxiety and depression symptoms, in addition, to healthy coping skills. 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 Patient reports significant worsening depression impacting their ability to function appropriately and carry out daily task. Patient has a limited social support network Patient is agreeable to referral to [facility] for counseling and psychiatry. [facility] LCSW made referral on 09/18/21. LCSW provided education on relaxation techniques such as meditation, deep breathing, massage, grounding exercises or yoga that can activate the body's relaxation response and ease symptoms of stress and anxiety.  Patient will work on implementing appropriate self-care habits into their daily routine such as: staying positive, writing a gratitude list, drinking water, staying active around the house, taking their medications as prescribed, combating negative thoughts or emotions and staying connected with ONLY positive support. Positive reinforcement provided for this decision to work on this.  Motivational Interviewing employed Depression screen reviewed  PHQ2/ PHQ9 completed Mindfulness or Relaxation training  provided Active listening / Reflection utilized  Advance Care and HCPOA education provided Emotional Support Provided Problem Solving /Task Center strategies reviewed Provided psychoeducation for mental health needs  Provided brief CBT  Reviewed mental health medications and discussed importance of compliance:  Quality of sleep assessed & Sleep Hygiene techniques promoted  Participation in counseling encouraged  Verbalization of feelings encouraged  Suicidal Ideation/Homicidal Ideation assessed: Patient denies SI/HI  Review resources, discussed options and provided patient information about  Mental Health Resources Inter-disciplinary care team collaboration (see longitudinal plan of care) Patient Goals/Self-Care Activities: Over the next 120 days Attend scheduled medical appointments Utilize healthy coping skills and supportive resources discussed Contact PCP with any questions or concerns Keep 90 percent of counseling appointments Call your insurance provider for more information about your Enhanced Benefits  Check out counseling resources provided  Begin personal counseling with LCSW, to reduce and manage symptoms of Depression and Stress, until well-established with mental health provider Accept all calls from representative with [facility] in an effort to establish ongoing mental health counseling and supportive services. Incorporate into daily practice - relaxation techniques, deep breathing exercises, and mindfulness meditation strategies. Talk about feelings with friends, family members, spiritual advisor, etc. Contact LCSW directly (#[phone]), if you have questions, need assistance, or if additional social work needs are identified between now and our next scheduled telephone outreach call. Call [phone] for mental health hotline/crisis line if needed (24/7 available) Try techniques to reduce symptoms of anxiety/negative thinking (deep breathing, distraction, positive self talk, etc)   -   develop a personal safety plan - develop a plan to deal with triggers like holidays, anniversaries - exercise at least 2 to 3 times per week - have a plan for how to handle bad days - journal feelings and what helps to feel better or worse - spend time or talk with others at least 2 to 3 times per week - watch for early signs of feeling worse - begin personal counseling - call and visit an old friend - check out volunteer opportunities - join a support group - laugh; watch a funny movie or comedian - learn and use visualization or guided imagery - perform a random act of kindness - practice relaxation or meditation daily - start or continue a personal journal - practice positive thinking and self-talk -continue with compliance of taking medication  -identify current effective and ineffective coping strategies.  -implement positive self-talk in care to increase self-esteem, confidence and feelings of control.  -consider alternative and complementary therapy approaches such as meditation, mindfulness or yoga.  -journaling, prayer, worship services, meditation or pastoral counseling.  -increase participation in pleasurable group activities such as hobbies, singing, sports or volunteering).  -consider the use of meditative movement therapy such as tai chi, yoga or qigong.  -start a regular daily exercise program based on tolerance, ability and patient choice to support positive thinking and activity    Patient Goals: Initial goal  Eula Fried, [place], MSW, [employer] Managed Medicaid LCSW [email or web address] Phone: [phone]

## 2021-09-21 NOTE — Patient Outreach (Signed)
Medicaid Managed Care Social Work Note  09/21/2021 Name:  Brittany Copeland MRN:  [id number] DOB:  Dec 07, 1985  Levie Owensby is an 36 y.o. year old female who is a primary patient of Tower, [name], MD.  The Medicaid Managed Care Coordination team was consulted for assistance with:  Mental Health Counseling and Resources  Ms. Melcher was given information about Medicaid Managed Care Coordination team services today. Brittany Copeland Patient agreed to services and verbal consent obtained.  Engaged with patient  for by telephone forinitial visit in response to referral for case management and/or care coordination services.   Assessments/Interventions:  Review of past medical history, allergies, medications, health status, including review of consultants reports, laboratory and other test data, was performed as part of comprehensive evaluation and provision of chronic care management services.  SDOH: (Social Determinant of Health) assessments and interventions performed: SDOH Interventions    Flowsheet Row Most Recent Value  SDOH Interventions   Stress Interventions Provide Counseling, Offered [employer] Resources       Advanced Directives Status:  See Care Plan for related entries.  Care Plan                 Allergies  Allergen Reactions   Pineapple Anaphylaxis   Augmentin [Amoxicillin-Pot Clavulanate] Nausea And Vomiting and Other (See Comments)    Has patient had a PCN reaction causing immediate rash, facial/tongue/throat swelling, SOB or lightheadedness with hypotension: No Has patient had a PCN reaction causing severe rash involving mucus membranes or skin necrosis: No Has patient had a PCN reaction that required hospitalization No Has patient had a PCN reaction occurring within the last 10 years: Yes If all of the above answers are "NO", then may proceed with Cephalosporin use.    Flonase [Fluticasone Propionate]     Made pt feel flushed and face turned red    Gluten Meal Other (See Comments)    Pt has celiac disease.      Medications Reviewed Today     Reviewed by Brittany Cutter, LCSW (Social Worker) on 09/18/21 at 1726  Med List Status: <None>   Medication Order Taking? Sig Documenting Provider Last Dose Status Informant  ADVAIR DISKUS 250-50 MCG/ACT AEPB [id number] No Inhale 1 puff into the lungs 2 (two) times daily as needed. [provider] Taking Active   b complex vitamins capsule [id number] No Take 1 capsule by mouth daily. [provider] Taking Active   Biotin w/ Vitamins C & E (HAIR/SKIN/NAILS PO) [id number] No Take by mouth. [provider] Taking Active   buPROPion [facility] SR) 150 MG 12 hr tablet [id number] No Take 1 tablet by mouth in the morning and at bedtime. [provider] Taking Active   Multiple Vitamin (MULTIVITAMIN) capsule [id number] No Take 1 capsule by mouth daily. [provider] Taking Active   Probiotic Product (PROBIOTIC DAILY PO) [id number] No Take 2 tablets by mouth 2 (two) times a week. 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   Abnormal cervical Pap smear  with positive HPV DNA test 04/25/2013   Celiac disease 04/19/2013   Routine general medical examination at a health care facility 04/18/2013   Encounter for routine gynecological examination 04/18/2013   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   MIGRAINE HEADACHE 07/20/2006    Conditions to be addressed/monitored per PCP order:  Depression  Care Plan :  LCSW Plan of Care  Updates made by Brittany Cutter, LCSW since 09/21/2021 12:00 AM     Problem: Depression Identification (Depression)      Long-Range Goal: Depressive Symptoms Identified   Start Date: 09/18/2021  This Visit's Progress: On track  Note:   Priority: High  Timeframe:  Long-Range Goal Priority:  High Start Date:   09/18/21              Expected End Date:  ongoing                    Follow Up Date--09/29/21 at 2:30 pm  - keep 90 percent of scheduled appointments -consider counseling or psychiatry -consider bumping up your self-care  -consider creating a stronger support network   Why is this important?             Beating depression may take some time.            If you don't feel better right away, don't give up on your treatment plan.    Current barriers:   Chronic Mental Health needs related to depression, stress and anxiety. Patient requires Support, Education, Resources, Referrals, Advocacy, and Care Coordination, in order to meet Unmet Mental Health Needs. Patient will implement clinical interventions discussed today to decrease symptoms of depression and increase knowledge and/or ability of: coping skills. Mental Health Concerns and Social Isolation Patient lacks knowledge of available community counseling agencies and resources.  Clinical Goal(s): verbalize understanding of plan for management of Anxiety, Depression, and Stress and demonstrate a reduction in symptoms. Patient will connect with a provider for ongoing mental health treatment, increase coping skills, healthy habits, self-management skills, and stress reduction        Clinical Interventions:  Assessed patient's previous and current treatment, coping skills, support system and barriers to care. Patient provided hx. Patient reports that her ex significant other does not reside with her and that it is only her and her daughter in the home. Patient confirms that she is safe. Safety plan discussed.   Verbalization of feelings encouraged, motivational interviewing employed Emotional support provided, positive coping strategies explored Self care/establishing healthy boundaries emphasized Patient was educated on available mental health resources within their area that accept Medicaid and offer counseling and psychiatry. Patient is already taking a psychotropic medication for depression and does not wish to pursue psychiatry at this time. She reports that her depression increased due to being in an unhealthy relationship. She is no longer in that relationship.  Patient reports that his medication for depression is no longer working and he needs help with finding psychiatry and counseling. He was successful in identifying triggers to anxiety and depression symptoms, in addition, to healthy coping skills. 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 Patient reports significant worsening depression impacting their ability to function appropriately and carry out daily task. Patient has a limited social support network Patient is agreeable to referral to [facility] for counseling and psychiatry. [facility] LCSW made referral on 09/18/21. LCSW provided education on relaxation techniques such as meditation, deep breathing,  massage, grounding exercises or yoga that can activate the body's relaxation response and ease symptoms of stress and anxiety.  Patient will work on implementing appropriate self-care habits into their daily routine such as: staying positive, writing a gratitude list, drinking water, staying active around the house, taking their medications as prescribed, combating negative thoughts or emotions and staying connected with ONLY positive support. Positive reinforcement provided for this decision to work on this.  Motivational Interviewing employed Depression screen reviewed  PHQ2/ PHQ9 completed Mindfulness or  Relaxation training provided Active listening / Reflection utilized  Advance Care and HCPOA education provided Emotional Support Provided Problem [place] strategies reviewed Provided psychoeducation for mental health needs  Provided brief CBT  Reviewed mental health medications and discussed importance of compliance:  Quality of sleep assessed & Sleep Hygiene techniques promoted  Participation in counseling encouraged  Verbalization of feelings encouraged  Suicidal Ideation/Homicidal Ideation assessed: Patient denies SI/HI  Review resources, discussed options and provided patient information about  [facility] care team collaboration (see longitudinal plan of care) Patient Goals/Self-Care Activities: Over the next 120 days Attend scheduled medical appointments Utilize healthy coping skills and supportive resources discussed Contact PCP with any questions or concerns Keep 90 percent of counseling appointments Call your insurance provider for more information about your Enhanced Benefits  Check out counseling resources provided  Begin personal counseling with LCSW, to reduce and manage symptoms of Depression and Stress, until well-established with mental health provider Accept all calls from representative with [facility] in an effort to establish ongoing mental health counseling and supportive services. Incorporate into daily practice - relaxation techniques, deep breathing exercises, and mindfulness meditation strategies. Talk about feelings with friends, family members, spiritual advisor, etc. Contact LCSW directly [phone]), if you have questions, need assistance, or if additional social work needs are identified between now and our next scheduled telephone outreach call. Call [phone] for mental health hotline/crisis line if needed (24/7 available) Try techniques to reduce symptoms of anxiety/negative thinking (deep breathing, distraction,  positive self talk, etc)  - develop a personal safety plan - develop a plan to deal with triggers like holidays, anniversaries - exercise at least 2 to 3 times per week - have a plan for how to handle bad days - journal feelings and what helps to feel better or worse - spend time or talk with others at least 2 to 3 times per week - watch for early signs of feeling worse - begin personal counseling - call and visit an old friend - check out volunteer opportunities - join a support group - laugh; watch a funny movie or comedian - learn and use visualization or guided imagery - perform a random act of kindness - practice relaxation or meditation daily - start or continue a personal journal - practice positive thinking and self-talk -continue with compliance of taking medication  -identify current effective and ineffective coping strategies.  -implement positive self-talk in care to increase self-esteem, confidence and feelings of control.  -consider alternative and complementary therapy approaches such as meditation, mindfulness or yoga.  -journaling, prayer, worship services, meditation or pastoral counseling.  -increase participation in pleasurable group activities such as hobbies, singing, sports or volunteering).  -consider the use of meditative movement therapy such as tai chi, yoga or qigong.  -start a regular daily exercise program based on tolerance, ability and patient choice to support positive thinking and activity    Patient Goals: Initial goal     Follow up:  Patient  agrees to Care Plan and Follow-up.  Plan: The Managed Medicaid care management team will reach out to the patient again over the next 30 days.  Date/time of next scheduled Social Work care management/care coordination outreach:  09/29/21 at 2:30 pm.  Eula Fried, BSW, MSW, [place] Medicaid LCSW [email or web address] Phone: [phone]

## 2021-09-29 ENCOUNTER — Other Ambulatory Visit: Payer: Self-pay | Admitting: Licensed Clinical Social Worker

## 2021-10-13 ENCOUNTER — Other Ambulatory Visit: Payer: Self-pay | Admitting: Licensed Clinical Social Worker

## 2021-10-13 NOTE — Patient Outreach (Signed)
Medicaid Managed Care Social Work Note  10/13/2021 Name:  Brittany Copeland MRN:  [id number] DOB:  07/16/1985  Brittany Copeland is an 36 y.o. year old female who is a primary patient of Tower, [name], MD.  The Medicaid Managed Care Coordination team was consulted for assistance with:  Mental Health Counseling and Resources  Ms. Pica was given information about Medicaid Managed Care Coordination team services today. [name] Patient agreed to services and verbal consent obtained.  Engaged with patient  for by telephone forfollow up visit in response to referral for case management and/or care coordination services.   Assessments/Interventions:  Review of past medical history, allergies, medications, health status, including review of consultants reports, laboratory and other test data, was performed as part of comprehensive evaluation and provision of chronic care management services.  SDOH: (Social Determinant of Health) assessments and interventions performed: SDOH Interventions    Flowsheet Row Most Recent Value  SDOH Interventions   Stress Interventions Offered [employer], Provide Counseling       Advanced Directives Status:  See Care Plan for related entries.  Care Plan                 Allergies  Allergen Reactions   Pineapple Anaphylaxis   Augmentin [Amoxicillin-Pot Clavulanate] Nausea And Vomiting and Other (See Comments)    Has patient had a PCN reaction causing immediate rash, facial/tongue/throat swelling, SOB or lightheadedness with hypotension: No Has patient had a PCN reaction causing severe rash involving mucus membranes or skin necrosis: No Has patient had a PCN reaction that required hospitalization No Has patient had a PCN reaction occurring within the last 10 years: Yes If all of the above answers are "NO", then may proceed with Cephalosporin use.    Flonase [Fluticasone Propionate]     Made pt feel flushed and face turned red    Gluten Meal Other (See Comments)    Pt has celiac disease.      Medications Reviewed Today     Reviewed by [name], LCSW (Social Worker) on 10/13/21 at 1441  Med List Status: <None>   Medication Order Taking? Sig Documenting Provider Last Dose Status Informant  ADVAIR DISKUS 250-50 MCG/ACT AEPB [id number] No Inhale 1 puff into the lungs 2 (two) times daily as needed. [provider] Taking Active   b complex vitamins capsule [id number] No Take 1 capsule by mouth daily. [provider] Taking Active   Biotin w/ Vitamins C & E (HAIR/SKIN/NAILS PO) [id number] No Take by mouth. [provider] Taking Active   buPROPion [facility] SR) 150 MG 12 hr tablet [id number] No Take 1 tablet by mouth in the morning and at bedtime. [provider] Taking Active   Multiple Vitamin (MULTIVITAMIN) capsule [id number] No Take 1 capsule by mouth daily. [provider] Taking Active   Probiotic Product (PROBIOTIC DAILY PO) [id number] No Take 2 tablets by mouth 2 (two) times a week. 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   Abnormal cervical Pap  smear with positive HPV DNA test 04/25/2013   Celiac disease 04/19/2013   Routine general medical examination at a health care facility 04/18/2013   Encounter for routine gynecological examination 04/18/2013   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   MIGRAINE HEADACHE 07/20/2006    Conditions to be addressed/monitored per PCP order:  Anxiety and  Depression  Care Plan : LCSW Plan of Care  Updates made by [name], LCSW since 10/13/2021 12:00 AM     Problem: Depression Identification (Depression)      Long-Range Goal: Depressive Symptoms Identified   Start Date: 09/18/2021  Recent Progress: On track  Note:   Priority: High  Timeframe:  Long-Range Goal Priority:  High Start Date:   09/18/21              Expected End Date:  ongoing                    Follow Up Date--11/19/21 at 1:00 pm  - keep 90 percent of scheduled appointments -consider counseling or psychiatry -consider bumping up your self-care  -consider creating a stronger support network   Why is this important?             Beating depression may take some time.            If you don't feel better right away, don't give up on your treatment plan.    Current barriers:   Chronic Mental Health needs related to depression, stress and anxiety. Patient requires Support, Education, Resources, Referrals, Advocacy, and Care Coordination, in order to meet Unmet Mental Health Needs. Patient will implement clinical interventions discussed today to decrease symptoms of depression and increase knowledge and/or ability of: coping skills. Mental Health Concerns and Social Isolation Patient lacks knowledge of available community counseling agencies and resources.  Clinical Goal(s): verbalize understanding of plan for management of Anxiety, Depression, and Stress and demonstrate a reduction in symptoms. Patient will connect with a provider for ongoing mental health treatment, increase coping skills, healthy habits, self-management skills, and stress reduction        Clinical Interventions:  Assessed patient's previous and current treatment, coping skills, support system and barriers to care. Patient provided hx. Patient reports that her ex significant other does not reside with her and that it is only her and her daughter in the home. Patient confirms that she is safe. Safety  plan discussed.  Verbalization of feelings encouraged, motivational interviewing employed Emotional support provided, positive coping strategies explored Self care/establishing healthy boundaries emphasized Patient was educated on available mental health resources within their area that accept Medicaid and offer counseling and psychiatry. Patient is already taking a psychotropic medication for depression and does not wish to pursue psychiatry at this time. She reports that her depression increased due to being in an unhealthy relationship. She is no longer in that relationship.  Patient reports that his medication for depression is no longer working and he needs help with finding psychiatry and counseling. He was successful in identifying triggers to anxiety and depression symptoms, in addition, to healthy coping skills. 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 Patient reports significant worsening depression impacting their ability to function appropriately and carry out daily task. Patient has a limited social support network Patient is agreeable to referral to [facility] for counseling and psychiatry. [facility] LCSW made referral on 09/18/21. LCSW provided education on relaxation techniques such as meditation,  deep breathing, massage, grounding exercises or yoga that can activate the body's relaxation response and ease symptoms of stress and anxiety.  UPDATE 09/29/21- Patient's previous significant other has been trying to outreach patient but patient reports that she has his number blocked. She reports that she is safe and taking all precautions to continue that. She reports that she has not heard back from [facility] and contacted them twice and left a message to schedule both a psychiatry and counseling appointment but still has not heard back. [facility] LCSW completed a joint call to [facility] with patient and was unable to reach them as  well but left a detailed voice message encouraging them to follow up with patient. Additional email was sent out to patient today with [facility]'s contact information as well as [facility] LCSW's. If patient has not heard back from [facility] by 10/09/21 then she will let [facility] LCSW know.  Patient will work on implementing appropriate self-care habits into their daily routine such as: staying positive, writing a gratitude list, drinking water, staying active around the house, taking their medications as prescribed, combating negative thoughts or emotions and staying connected with ONLY positive support. Positive reinforcement provided for this decision to work on this. UPDATE 10/13/21- Patient has still not heard back from [facility] even after 4 attempts. [facility] LCSW and patient made joint call to [facility] and left 2 additional voice messages before being able to reach someone. [facility] LCSW was able to successfully schedule patient for counseling on 11/18/21 at 10 am. Email sent with contact information and walk in hours for [facility]. Patient reports that her anxiety has increased lately and she is eager to start treatment but no longer wishes to gain psychiatry. She reports only needing counseling at this time. Patient reports that she is currently applying for school. Patient is agreeable to contact [facility] LCSW if needed before next scheduled outreach call on 11/19/21.  Motivational Interviewing employed Depression screen reviewed  PHQ2/ PHQ9 completed Mindfulness or Relaxation training provided Active listening / Reflection utilized  Advance Care and HCPOA education provided Emotional Support Provided Problem [place] strategies reviewed Provided psychoeducation for mental health needs  Provided brief CBT  Reviewed mental health medications and discussed importance of compliance:  Quality of sleep assessed & Sleep Hygiene techniques promoted  Participation in counseling encouraged  Verbalization of feelings encouraged   Suicidal Ideation/Homicidal Ideation assessed: Patient denies SI/HI  Review resources, discussed options and provided patient information about  [place] care team collaboration (see longitudinal plan of care) Patient Goals/Self-Care Activities: Over the next 120 days Attend scheduled medical appointments Utilize healthy coping skills and supportive resources discussed Contact PCP with any questions or concerns Keep 90 percent of counseling appointments Call your insurance provider for more information about your Enhanced Benefits  Check out counseling resources provided  Begin personal counseling with LCSW, to reduce and manage symptoms of Depression and Stress, until well-established with mental health provider Accept all calls from representative with [facility] in an effort to establish ongoing mental health counseling and supportive services. Incorporate into daily practice - relaxation techniques, deep breathing exercises, and mindfulness meditation strategies. Talk about feelings with friends, family members, spiritual advisor, etc. Contact LCSW directly [phone]), if you have questions, need assistance, or if additional social work needs are identified between now and our next scheduled telephone outreach call. Call [phone] for mental health hotline/crisis line if needed (24/7 available) Try techniques to reduce symptoms of anxiety/negative thinking (deep breathing, distraction, positive self talk,  etc)  - develop a personal safety plan - develop a plan to deal with triggers like holidays, anniversaries - exercise at least 2 to 3 times per week - have a plan for how to handle bad days - journal feelings and what helps to feel better or worse - spend time or talk with others at least 2 to 3 times per week - watch for early signs of feeling worse - begin personal counseling - call and visit an old friend - check out volunteer opportunities - join a  support group - laugh; watch a funny movie or comedian - learn and use visualization or guided imagery - perform a random act of kindness - practice relaxation or meditation daily - start or continue a personal journal - practice positive thinking and self-talk -continue with compliance of taking medication  -identify current effective and ineffective coping strategies.  -implement positive self-talk in care to increase self-esteem, confidence and feelings of control.  -consider alternative and complementary therapy approaches such as meditation, mindfulness or yoga.  -journaling, prayer, worship services, meditation or pastoral counseling.  -increase participation in pleasurable group activities such as hobbies, singing, sports or volunteering).  -consider the use of meditative movement therapy such as tai chi, yoga or qigong.  -start a regular daily exercise program based on tolerance, ability and patient choice to support positive thinking and activity    The following coping skill education was provided for stress relief and mental health management: "When your car dies or a deadline looms, how do you respond? Long-term, low-grade or acute stress takes a serious toll on your body and mind, so don't ignore feelings of constant tension. Stress is a natural part of life. However, too much stress can harm our health, especially if it continues every day. This is chronic stress and can put you at risk for heart problems like heart disease and depression. Understand what's happening inside your body and learn simple coping skills to combat the negative impacts of everyday stressors.  Types of Stress There are two types of stress: Emotional - types of emotional stress are relationship problems, pressure at work, financial worries, experiencing discrimination or having a major life change. Physical - Examples of physical stress include being sick having pain, not sleeping well, recovery from an  injury or having an alcohol and drug use disorder. Fight or Flight Sudden or ongoing stress activates your nervous system and floods your bloodstream with adrenaline and cortisol, two hormones that raise blood pressure, increase heart rate and spike blood sugar. These changes pitch your body into a fight or flight response. That enabled our ancestors to outrun saber-toothed tigers, and it's helpful today for situations like dodging a car accident. But most modern chronic stressors, such as finances or a challenging relationship, keep your body in that heightened state, which hurts your health. Effects of Too Much Stress If constantly under stress, most of [place] will eventually start to function less well.  Multiple studies link chronic stress to a higher risk of heart disease, stroke, depression, weight gain, memory loss and even premature death, so it's important to recognize the warning signals. Talk to your doctor about ways to manage stress if you're experiencing any of these symptoms: Prolonged periods of poor sleep. Regular, severe headaches. Unexplained weight loss or gain. Feelings of isolation, withdrawal or worthlessness. Constant anger and irritability. Loss of interest in activities. Constant worrying or obsessive thinking. Excessive alcohol or drug use. Inability to concentrate.  10 Ways to [employer]  with Chronic Stress It's key to recognize stressful situations as they occur because it allows you to focus on managing how you react. We all need to know when to close our eyes and take a deep breath when we feel tension rising. Use these tips to prevent or reduce chronic stress. 1. Rebalance Work and Home All work and no play? If you're spending too much time at the office, intentionally put more dates in your calendar to enjoy time for fun, either alone or with others. 2. Get Regular Exercise Moving your body on a regular basis balances the nervous system and increases blood circulation,  helping to flush out stress hormones. Even a daily 20-minute walk makes a difference. Any kind of exercise can lower stress and improve your mood ? just pick activities that you enjoy and make it a regular habit. 3. Eat Well and Limit Alcohol and Stimulants Alcohol, nicotine and caffeine may temporarily relieve stress but have negative health impacts and can make stress worse in the long run. Well-nourished bodies cope better, so start with a good breakfast, add more organic fruits and vegetables for a well-balanced diet, avoid processed foods and sugar, try herbal tea and drink more water. 4. Connect with Supportive People Talking face to face with another person releases hormones that reduce stress. Lean on those good listeners in your life. 5. [place] Time Do you enjoy gardening, reading, listening to music or some other creative pursuit? Engage in activities that bring you pleasure and joy; research shows that reduces stress by almost half and lowers your heart rate, too. 6. Practice Meditation, Stress Reduction or Yoga Relaxation techniques activate a state of restfulness that counterbalances your body's fight-or-flight hormones. Even if this also means a 10-minute break in a long day: listen to music, read, go for a walk in nature, do a hobby, take a bath or spend time with a friend. Also consider doing a mindfulness exercise or try a daily deep breathing or imagery practice. Deep Breathing Slow, calm and deep breathing can help you relax. Try these steps to focus on your breathing and repeat as needed. Find a comfortable position and close your eyes. Exhale and drop your shoulders. Breathe in through your nose; fill your lungs and then your belly. Think of relaxing your body, quieting your mind and becoming calm and peaceful. Breathe out slowly through your nose, relaxing your belly. Think of releasing tension, pain, worries or distress. Repeat steps three and four until you feel  relaxed. Imagery This involves using your mind to excite the senses -- sound, vision, smell, taste and feeling. This may help ease your stress. Begin by getting comfortable and then do some slow breathing. Imagine a place you love being at. It could be somewhere from your childhood, somewhere you vacationed or just a place in your imagination. Feel how it is to be in the place you're imagining. Pay attention to the sounds, air, colors, and who is there with you. This is a place where you feel cared for and loved. All is well. You are safe. Take in all the smells, sounds, tastes and feelings. As you do, feel your body being nourished and healed. Feel the calm that surrounds you. Breathe in all the good. Breathe out any discomfort or tension. 7. Sleep Enough If you get less than seven to eight hours of sleep, your body won't tolerate stress as well as it could. If stress keeps you up at night, address the cause, and  add extra meditation into your day to make up for the lost z's. Try to get seven to nine hours of sleep each night. Make a regular bedtime schedule. Keep your room dark and cool. Try to avoid computers, TV, cell phones and tablets before bed. 8. Bond with Connections You Enjoy Go out for a coffee with a friend, chat with a neighbor, call a family member, visit with a clergy member, or even hang out with your pet. Clinical studies show that spending even a short time with a companion animal can cut anxiety levels almost in half. 9. Take a Vacation Getting away from it all can reset your stress tolerance by increasing your mental and emotional outlook, which makes you a happier, more productive person upon return. Leave your cellphone and laptop at home! 10. See a Counselor, Coach or Therapist If negative thoughts overwhelm your ability to make positive changes, it's time to seek professional help. Make an appointment today--your health and life are worth it."   Patient Goals: Follow up  goal     Follow up:  Patient agrees to Care Plan and Follow-up.  Plan: The Managed Medicaid care management team will reach out to the patient again over the next 45 days.  Date/time of next scheduled Social Work care management/care coordination outreach:  11/19/21 at 2:30 pm.   Eula Fried, BSW, MSW, [place] Medicaid LCSW [email or web address] Phone: [phone]

## 2021-10-13 NOTE — Patient Instructions (Signed)
Visit Information  Ms. Alfonzo was given information about Medicaid Managed Care team care coordination services as a part of their Healthy [facility] Medicaid benefit. Robbyn Hodkinson verbally consented to engagement with the [facility] Managed Care team.   If you are experiencing a medical emergency, please call 911 or report to your local emergency department or urgent care.   If you have a non-emergency medical problem during routine business hours, please contact your provider's office and ask to speak with a nurse.   For questions related to your Healthy [facility] health plan, please call: [phone] or visit the homepage here: [email or web address]  If you would like to schedule transportation through your [employer] plan, please call the following number at least 2 days in advance of your appointment: [phone]  For information about your ride after you set it up, call Ride Assist at [phone]. Use this number to activate a Will Call pickup, or if your transportation is late for a scheduled pickup. Use this number, too, if you need to make a change or cancel a previously scheduled reservation.  If you need transportation services right away, call [phone]. The after-hours call center is staffed 24 hours to handle ride assistance and urgent reservation requests (including discharges) 365 days a year. Urgent trips include sick visits, hospital discharge requests and life-sustaining treatment.  Call the [place] at [phone], at any time, 24 hours a day, 7 days a week. If you are in danger or need immediate medical attention call 911.  If you would like help to quit smoking, call 1-800-QUIT-NOW 615-773-2859) OR Espaol: 1-855-Djelo-Ya (7-564-332-9518) o para ms informacin haga clic aqu or Text READY to 200-400 to register via text   Following is a copy of your plan of care:  Care Plan : LCSW Plan of Care   Updates made by [name], LCSW since 10/13/2021 12:00 AM     Problem: Depression Identification (Depression)      Long-Range Goal: Depressive Symptoms Identified   Start Date: 09/18/2021  Recent Progress: On track  Note:   Priority: High  Timeframe:  Long-Range Goal Priority:  High Start Date:   09/18/21              Expected End Date:  ongoing                    Follow Up Date--11/19/21 at 1:00 pm  - keep 90 percent of scheduled appointments -consider counseling or psychiatry -consider bumping up your self-care  -consider creating a stronger support network   Why is this important?             Beating depression may take some time.            If you don't feel better right away, don't give up on your treatment plan.    Current barriers:   Chronic Mental Health needs related to depression, stress and anxiety. Patient requires Support, Education, Resources, Referrals, Advocacy, and Care Coordination, in order to meet Unmet Mental Health Needs. Patient will implement clinical interventions discussed today to decrease symptoms of depression and increase knowledge and/or ability of: coping skills. Mental Health Concerns and Social Isolation Patient lacks knowledge of available community counseling agencies and resources.  Clinical Goal(s): verbalize understanding of plan for management of Anxiety, Depression, and Stress and demonstrate a reduction in symptoms. Patient will connect with a provider for ongoing mental health treatment, increase coping skills, healthy habits, self-management skills, and stress reduction  Patient Goals/Self-Care Activities: Over the next 120 days Attend scheduled medical appointments Utilize healthy coping skills and supportive resources discussed Contact PCP with any questions or concerns Keep 90 percent of counseling appointments Call your insurance provider for more information about your Enhanced Benefits  Check out counseling  resources provided  Begin personal counseling with LCSW, to reduce and manage symptoms of Depression and Stress, until well-established with mental health provider Accept all calls from representative with [facility] in an effort to establish ongoing mental health counseling and supportive services. Incorporate into daily practice - relaxation techniques, deep breathing exercises, and mindfulness meditation strategies. Talk about feelings with friends, family members, spiritual advisor, etc. Contact LCSW directly [phone]), if you have questions, need assistance, or if additional social work needs are identified between now and our next scheduled telephone outreach call. Call [phone] for mental health hotline/crisis line if needed (24/7 available) Try techniques to reduce symptoms of anxiety/negative thinking (deep breathing, distraction, positive self talk, etc)  - develop a personal safety plan - develop a plan to deal with triggers like holidays, anniversaries - exercise at least 2 to 3 times per week - have a plan for how to handle bad days - journal feelings and what helps to feel better or worse - spend time or talk with others at least 2 to 3 times per week - watch for early signs of feeling worse - begin personal counseling - call and visit an old friend - check out volunteer opportunities - join a support group - laugh; watch a funny movie or comedian - learn and use visualization or guided imagery - perform a random act of kindness - practice relaxation or meditation daily - start or continue a personal journal - practice positive thinking and self-talk -continue with compliance of taking medication  -identify current effective and ineffective coping strategies.  -implement positive self-talk in care to increase self-esteem, confidence and feelings of control.  -consider alternative and complementary therapy approaches such as meditation, mindfulness or yoga.  -journaling, prayer,  worship services, meditation or pastoral counseling.  -increase participation in pleasurable group activities such as hobbies, singing, sports or volunteering).  -consider the use of meditative movement therapy such as tai chi, yoga or qigong.  -start a regular daily exercise program based on tolerance, ability and patient choice to support positive thinking and activity    The following coping skill education was provided for stress relief and mental health management: "When your car dies or a deadline looms, how do you respond? Long-term, low-grade or acute stress takes a serious toll on your body and mind, so don't ignore feelings of constant tension. Stress is a natural part of life. However, too much stress can harm our health, especially if it continues every day. This is chronic stress and can put you at risk for heart problems like heart disease and depression. Understand what's happening inside your body and learn simple coping skills to combat the negative impacts of everyday stressors.  Types of Stress There are two types of stress: Emotional - types of emotional stress are relationship problems, pressure at work, financial worries, experiencing discrimination or having a major life change. Physical - Examples of physical stress include being sick having pain, not sleeping well, recovery from an injury or having an alcohol and drug use disorder. Fight or Flight Sudden or ongoing stress activates your nervous system and floods your bloodstream with adrenaline and cortisol, two hormones that raise blood pressure, increase heart rate and spike blood  sugar. These changes pitch your body into a fight or flight response. That enabled our ancestors to outrun saber-toothed tigers, and it's helpful today for situations like dodging a car accident. But most modern chronic stressors, such as finances or a challenging relationship, keep your body in that heightened state, which hurts your  health. Effects of Too Much Stress If constantly under stress, most of [place] will eventually start to function less well.  Multiple studies link chronic stress to a higher risk of heart disease, stroke, depression, weight gain, memory loss and even premature death, so it's important to recognize the warning signals. Talk to your doctor about ways to manage stress if you're experiencing any of these symptoms: Prolonged periods of poor sleep. Regular, severe headaches. Unexplained weight loss or gain. Feelings of isolation, withdrawal or worthlessness. Constant anger and irritability. Loss of interest in activities. Constant worrying or obsessive thinking. Excessive alcohol or drug use. Inability to concentrate.  10 Ways to Cope with Chronic Stress It's key to recognize stressful situations as they occur because it allows you to focus on managing how you react. We all need to know when to close our eyes and take a deep breath when we feel tension rising. Use these tips to prevent or reduce chronic stress. 1. Rebalance Work and Home All work and no play? If you're spending too much time at the office, intentionally put more dates in your calendar to enjoy time for fun, either alone or with others. 2. Get Regular Exercise Moving your body on a regular basis balances the nervous system and increases blood circulation, helping to flush out stress hormones. Even a daily 20-minute walk makes a difference. Any kind of exercise can lower stress and improve your mood ? just pick activities that you enjoy and make it a regular habit. 3. Eat Well and Limit Alcohol and Stimulants Alcohol, nicotine and caffeine may temporarily relieve stress but have negative health impacts and can make stress worse in the long run. Well-nourished bodies cope better, so start with a good breakfast, add more organic fruits and vegetables for a well-balanced diet, avoid processed foods and sugar, try herbal tea and drink more  water. 4. Connect with Supportive People Talking face to face with another person releases hormones that reduce stress. Lean on those good listeners in your life. 5. [place] Time Do you enjoy gardening, reading, listening to music or some other creative pursuit? Engage in activities that bring you pleasure and joy; research shows that reduces stress by almost half and lowers your heart rate, too. 6. Practice Meditation, Stress Reduction or Yoga Relaxation techniques activate a state of restfulness that counterbalances your body's fight-or-flight hormones. Even if this also means a 10-minute break in a long day: listen to music, read, go for a walk in nature, do a hobby, take a bath or spend time with a friend. Also consider doing a mindfulness exercise or try a daily deep breathing or imagery practice. Deep Breathing Slow, calm and deep breathing can help you relax. Try these steps to focus on your breathing and repeat as needed. Find a comfortable position and close your eyes. Exhale and drop your shoulders. Breathe in through your nose; fill your lungs and then your belly. Think of relaxing your body, quieting your mind and becoming calm and peaceful. Breathe out slowly through your nose, relaxing your belly. Think of releasing tension, pain, worries or distress. Repeat steps three and four until you feel relaxed. Imagery This involves  using your mind to excite the senses -- sound, vision, smell, taste and feeling. This may help ease your stress. Begin by getting comfortable and then do some slow breathing. Imagine a place you love being at. It could be somewhere from your childhood, somewhere you vacationed or just a place in your imagination. Feel how it is to be in the place you're imagining. Pay attention to the sounds, air, colors, and who is there with you. This is a place where you feel cared for and loved. All is well. You are safe. Take in all the smells, sounds, tastes and  feelings. As you do, feel your body being nourished and healed. Feel the calm that surrounds you. Breathe in all the good. Breathe out any discomfort or tension. 7. Sleep Enough If you get less than seven to eight hours of sleep, your body won't tolerate stress as well as it could. If stress keeps you up at night, address the cause, and add extra meditation into your day to make up for the lost z's. Try to get seven to nine hours of sleep each night. Make a regular bedtime schedule. Keep your room dark and cool. Try to avoid computers, TV, cell phones and tablets before bed. 8. Bond with Connections You Enjoy Go out for a coffee with a friend, chat with a neighbor, call a family member, visit with a clergy member, or even hang out with your pet. Clinical studies show that spending even a short time with a companion animal can cut anxiety levels almost in half. 9. Take a Vacation Getting away from it all can reset your stress tolerance by increasing your mental and emotional outlook, which makes you a happier, more productive person upon return. Leave your cellphone and laptop at home! 10. See a Counselor, Coach or Therapist If negative thoughts overwhelm your ability to make positive changes, it's time to seek professional help. Make an appointment today--your health and life are worth it."   Patient Goals: Follow up goal  Eula Fried, BSW, MSW, [employer] Managed Medicaid LCSW [email or web address] Phone: [phone]

## 2021-10-19 ENCOUNTER — Ambulatory Visit: Payer: Medicaid Other

## 2021-10-27 ENCOUNTER — Encounter: Payer: Self-pay | Admitting: Family Medicine

## 2021-10-27 MED ORDER — BUPROPION HCL ER (SR) 150 MG PO TB12
300.0000 mg | ORAL_TABLET | Freq: Two times a day (BID) | ORAL | 2 refills | Status: DC
Start: 2021-10-27 — End: 2022-03-12

## 2021-11-18 ENCOUNTER — Ambulatory Visit (INDEPENDENT_AMBULATORY_CARE_PROVIDER_SITE_OTHER): Payer: Medicaid Other | Admitting: Licensed Clinical Social Worker

## 2021-11-18 DIAGNOSIS — F4323 Adjustment disorder with mixed anxiety and depressed mood: Secondary | ICD-10-CM
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
## 2021-11-18 NOTE — Plan of Care (Signed)
Pt verbally agreeable to plan

## 2021-12-10 ENCOUNTER — Ambulatory Visit (INDEPENDENT_AMBULATORY_CARE_PROVIDER_SITE_OTHER): Payer: Medicaid Other | Admitting: Licensed Clinical Social Worker

## 2021-12-10 DIAGNOSIS — F4323 Adjustment disorder with mixed anxiety and depressed mood: Secondary | ICD-10-CM

## 2021-12-10 NOTE — Progress Notes (Signed)
   THERAPIST PROGRESS NOTE  Virtual Visit via Video Note  I connected with Brittany Copeland on 12/10/21 at  1:00 PM EDT by a video enabled telemedicine application and verified that I am speaking with the correct person using two identifiers.  Location: Patient: [facility]  Provider: Providers Home    I discussed the limitations of evaluation and management by telemedicine and the availability of in person appointments. The patient expressed understanding and agreed to proceed.    I discussed the assessment and treatment plan with the patient. The patient was provided an opportunity to ask questions and all were answered. The patient agreed with the plan and demonstrated an understanding of the instructions.   The patient was advised to call back or seek an in-person evaluation if the symptoms worsen or if the condition fails to improve as anticipated.  I provided 40 minutes of non-face-to-face time during this encounter.   Dory Horn, LCSW   Participation Level: Active  Behavioral Response: CasualAlertAnxious and Depressed  Type of Therapy: Individual Therapy  Treatment Goals addressed:   ProgressTowards Goals: Progressing  Interventions: CBT, Motivational Interviewing, and Supportive    Suicidal/Homicidal: Nowithout intent/plan  Therapist Response:    Pt was alert and oriented x 5. She was dressed casually and engaged well in therapy session. She presented with anxious mood/affect. Pt was pleasant, cooperative, and maintained good eye contact. She   Pt comes in today with primary stressors of work, illness, and ex relationship. 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 She has the degree but did not take the test. She reports she has been doing practice questions for the past several weeks and hopes to take the test soon. Pt reports stress with her child as she had an ear infection the past two days and has been home.  Pt reports she improved enough today to go to school. Pt still is struggling with her ex-relationship. She reports she has not had contact with her ex who was cheating on her with another woman. Pt reports that she struggles daily as she is not sure how it got to this point. She does not want to get back with him but does not feel like she has closure either.   Intervention: LCSW used narrative therapy to tell the story of her ex-fianc in her words and from her perspective. LCSW used unconditional positive regard to use nonjudgment stance in session using person centered therapy.  Plan: Pt continues to process through her stressor using coping skills of working out, using friend and family support, and engaging in hobbies. Plan is to continue to use these skills to decrease anxiety and depression symptoms.   Plan: Return again in 3 weeks.  Diagnosis: No diagnosis found.  Collaboration of Care: Other None reported   Patient/Guardian was advised Release of Information must be obtained prior to any record release in order to collaborate their care with an outside provider. Patient/Guardian was advised if they have not already done so to contact the registration department to sign all necessary forms in order for [place] to release information regarding their care.   Consent: Patient/Guardian gives verbal consent for treatment and assignment of benefits for services provided during this visit. Patient/Guardian expressed understanding and agreed to proceed.   Dory Horn, LCSW 12/10/2021

## 2021-12-10 NOTE — Plan of Care (Signed)
  Problem: Anxiety Disorder CCP Problem  1 adjustment disorder  Goal:  workout 3 x weekly  Outcome: Progressing Goal: Identify 3 triggers for anxiety  Outcome: Progressing Goal: LTG: Patient will score less than 5 on the Generalized Anxiety Disorder 7 Scale (GAD-7) Outcome: Progressing Goal: STG: Patient will complete at least 80% of assigned homework Outcome: Progressing Goal: STG: Patient will practice problem solving skills 3 times per week for the next 4 weeks Outcome: Progressing   Problem: Depression CCP Problem  1 adjustment disorder  Goal: identify 3 triggers for depression  Outcome: Progressing Goal: LTG: Brittany "[name]" WILL SCORE LESS THAN 10 ON THE PATIENT HEALTH QUESTIONNAIRE (PHQ-9) Outcome: Progressing   Problem: Anxiety Disorder CCP Problem  1 adjustment disorder  Intervention: Discuss risks and benefits of medication treatment options for this problem and prescribe as indicated Intervention: Encourage patient to take psychotropic medication as prescribed Intervention: Work with patient to track symptoms, triggers and/or skill use through a mood chart, diary card, or journal   Problem: Depression CCP Problem  1 adjustment disorder  Intervention: WORK WITH Brittany [place] "[name]" TO TRACK SYMPTOMS, TRIGGERS AND/OR SKILL USE THROUGH A MOOD CHART, DIARY CARD, OR JOURNAL Intervention: ENCOURAGE Brittany "[name]" TO PARTICIPATE IN RECOVERY PEER SUPPORT ACTIVITIES WEEKLY

## 2021-12-31 ENCOUNTER — Ambulatory Visit (INDEPENDENT_AMBULATORY_CARE_PROVIDER_SITE_OTHER): Payer: Medicaid Other | Admitting: Licensed Clinical Social Worker

## 2021-12-31 DIAGNOSIS — F4323 Adjustment disorder with mixed anxiety and depressed mood: Secondary | ICD-10-CM | POA: Diagnosis not present
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
## 2022-01-12 ENCOUNTER — Ambulatory Visit: Payer: Medicaid Other | Admitting: Family Medicine

## 2022-01-12 ENCOUNTER — Encounter: Payer: Self-pay | Admitting: Family Medicine

## 2022-01-12 DIAGNOSIS — R058 Other specified cough: Secondary | ICD-10-CM | POA: Diagnosis not present

## 2022-01-12 MED ORDER — PROMETHAZINE-DM 6.25-15 MG/5ML PO SYRP: 5.0000 mL | ORAL_SOLUTION | Freq: Four times a day (QID) | ORAL | 0 refills | Status: DC | PRN

## 2022-01-12 MED ORDER — PREDNISONE 10 MG PO TABS: ORAL_TABLET | ORAL | 0 refills | Status: DC

## 2022-01-12 MED ORDER — FAMOTIDINE 20 MG PO TABS: 20.0000 mg | ORAL_TABLET | Freq: Two times a day (BID) | ORAL | 0 refills | Status: DC

## 2022-01-12 NOTE — Patient Instructions (Signed)
Drink fluids  Take pepcid 20 mg twice daily for a month for acid reflux and cough   Take prednisone as directed for bronchitis , wheezing and cough   Try prometh-dm for cough If too sedating stop it and let us know   Elevate head of bed for sleep if able   Update if not starting to improve in a week or if worsening

## 2022-01-12 NOTE — Progress Notes (Signed)
Subjective:    Patient ID: Brittany Copeland, female    DOB: June 09, 1985, 36 y.o.   MRN: [id number]  HPI Pt presents for cough   Wt Readings from Last 3 Encounters:  01/12/22 159 lb 8 oz (72.3 kg)  07/07/21 169 lb (76.7 kg)  06/02/17 164 lb 4 oz (74.5 kg)   24.80 kg/m  Cough for 5 weeks  Has a 36 year old Some allergies   Has coughed hard enough to vomit   Started one am with some throat sensitivity/irritation  That went on for a week  Then bad cough   Has reactive airways/ asthma with illness Uses advair when she is sick Occ wheezing   Cough is not productive  No rattle /not junky   Some heartburn also /acid  Took tums    Pulse ox 96% Non smoker   Otc Tums  Mucinex cough syrup -stopped it  Sudafed in the beginning Claritin   Patient Active Problem List   Diagnosis Date Noted   Post-viral cough syndrome 01/12/2022   Adjustment disorder with mixed anxiety and depressed mood 11/18/2021   Reactive depression 09/16/2021   Nipple lesion 07/07/2021   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   Acute sinusitis 05/11/2017   Plantar wart 11/22/2016   Premature rupture of membranes 11/02/2015   Vacuum extractor delivery, delivered 11/02/2015   Positive pregnancy test 04/08/2015   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   Right knee pain 09/11/2013   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   Abnormal cervical Pap smear with positive HPV DNA test 04/25/2013   Celiac disease 04/19/2013   Routine general medical examination at a health care facility 04/18/2013   Encounter for routine gynecological examination 04/18/2013   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   MIGRAINE HEADACHE 07/20/2006   Past Medical History:  Diagnosis Date   Celiac disease    Headache    Vaginal Pap smear, abnormal    Past Surgical History:  Procedure Laterality Date   ADENOIDECTOMY     Social History   Tobacco Use   Smoking status: Never   Smokeless tobacco: Never  Substance Use  Topics   Alcohol use: No    Alcohol/week: 0.0 standard drinks of alcohol   Drug use: No   Family History  Problem Relation Age of Onset   Heart disease Father    Hypertension Father    Seizures Mother    Heart disease Paternal Uncle    Diabetes Maternal Grandmother    Thyroid disease Maternal Grandmother    Cancer Paternal Grandfather    Allergies  Allergen Reactions   Pineapple Anaphylaxis   Augmentin [Amoxicillin-Pot Clavulanate] Nausea And Vomiting and Other (See Comments)    Has patient had a PCN reaction causing immediate rash, facial/tongue/throat swelling, SOB or lightheadedness with hypotension: No Has patient had a PCN reaction causing severe rash involving mucus membranes or skin necrosis: No Has patient had a PCN reaction that required hospitalization No Has patient had a PCN reaction occurring within the last 10 years: Yes If all of the above answers are "NO", then may proceed with Cephalosporin use.    Flonase [Fluticasone Propionate]     Made pt feel flushed and face turned red   Gluten Meal Other (See Comments)    Pt has celiac disease.     Current Outpatient Medications on File Prior to Visit  Medication Sig Dispense Refill   ADVAIR DISKUS 250-50 MCG/ACT AEPB Inhale 1 puff into the lungs 2 (two) times daily as needed.  b complex vitamins capsule Take 1 capsule by mouth daily.     Biotin w/ Vitamins C & E (HAIR/SKIN/NAILS PO) Take by mouth.     buPROPion (WELLBUTRIN SR) 150 MG 12 hr tablet Take 2 tablets (300 mg total) by mouth in the morning and at bedtime. 180 tablet 2   Multiple Vitamin (MULTIVITAMIN) capsule Take 1 capsule by mouth daily.     Probiotic Product (PROBIOTIC DAILY PO) Take 2 tablets by mouth 2 (two) times a week.     No current facility-administered medications on file prior to visit.     Review of Systems  Constitutional:  Negative for activity change, appetite change, fatigue, fever and unexpected weight change.  HENT:  Positive for  postnasal drip. Negative for congestion, ear pain, rhinorrhea, sinus pressure and sore throat.   Eyes:  Negative for pain, redness and visual disturbance.  Respiratory:  Positive for cough. Negative for shortness of breath and wheezing.   Cardiovascular:  Negative for chest pain and palpitations.  Gastrointestinal:  Negative for abdominal pain, blood in stool, constipation and diarrhea.       Vomited from cough   Endocrine: Negative for polydipsia and polyuria.  Genitourinary:  Negative for dysuria, frequency and urgency.  Musculoskeletal:  Negative for arthralgias, back pain and myalgias.  Skin:  Negative for pallor and rash.  Allergic/Immunologic: Negative for environmental allergies.  Neurological:  Negative for dizziness, syncope and headaches.  Hematological:  Negative for adenopathy. Does not bruise/bleed easily.  Psychiatric/Behavioral:  Negative for decreased concentration and dysphoric mood. The patient is not nervous/anxious.        Objective:   Physical Exam Constitutional:      General: She is not in acute distress.    Appearance: Normal appearance. She is well-developed and normal weight. She is not ill-appearing or diaphoretic.  HENT:     Head: Normocephalic and atraumatic.     Right Ear: Tympanic membrane, ear canal and external ear normal.     Left Ear: Tympanic membrane, ear canal and external ear normal.     Nose:     Comments: Boggy nares    Mouth/Throat:     Mouth: Mucous membranes are moist.     Pharynx: Oropharynx is clear. No oropharyngeal exudate or posterior oropharyngeal erythema.  Eyes:     General:        Right eye: No discharge.        Left eye: No discharge.     Conjunctiva/sclera: Conjunctivae normal.     Pupils: Pupils are equal, round, and reactive to light.  Neck:     Thyroid: No thyromegaly.     Vascular: No carotid bruit or JVD.  Cardiovascular:     Rate and Rhythm: Normal rate and regular rhythm.     Heart sounds: Normal heart sounds.      No gallop.  Pulmonary:     Effort: Pulmonary effort is normal. No respiratory distress.     Breath sounds: Normal breath sounds. No stridor. No wheezing, rhonchi or rales.     Comments: Good air exch  Scant wheeze only on forced exp No prolonged exp phase  Chest:     Chest wall: No tenderness.  Abdominal:     General: There is no distension or abdominal bruit.     Palpations: Abdomen is soft. There is no mass.     Tenderness: There is no abdominal tenderness.  Musculoskeletal:     Cervical back: Normal range of motion and neck supple.  Right lower leg: No edema.     Left lower leg: No edema.  Lymphadenopathy:     Cervical: No cervical adenopathy.  Skin:    General: Skin is warm and dry.     Coloration: Skin is not pale.     Findings: No rash.  Neurological:     Mental Status: She is alert.     Cranial Nerves: No cranial nerve deficit.     Coordination: Coordination normal.     Deep Tendon Reflexes: Reflexes are normal and symmetric. Reflexes normal.  Psychiatric:        Mood and Affect: Mood normal.           Assessment & Plan:   Problem List Items Addressed This Visit       Respiratory   Post-viral cough syndrome    S/p uri (sounds like viral bronchitis) 5 wk ago (with neg covid testing) Dry cough /no prod or fever H/o asthmatic flare with uri in past (uses advair) Reassuring exam Expect acid reflux is playing a role also  Px prednisone taper, prometh DM with caution of sedation and pepcid 20 mg bid  Update if not starting to improve in a week or if worsening  ER precautions noted  Reviewed s/s of pneumonia to watch for

## 2022-01-12 NOTE — Assessment & Plan Note (Signed)
S/p uri (sounds like viral bronchitis) 5 wk ago (with neg covid testing) Dry cough /no prod or fever H/o asthmatic flare with uri in past (uses advair) Reassuring exam Expect acid reflux is playing a role also  Px prednisone taper, prometh DM with caution of sedation and pepcid 20 mg bid  Update if not starting to improve in a week or if worsening  ER precautions noted  Reviewed s/s of pneumonia to watch for

## 2022-01-26 ENCOUNTER — Ambulatory Visit (INDEPENDENT_AMBULATORY_CARE_PROVIDER_SITE_OTHER): Payer: Medicaid Other | Admitting: Licensed Clinical Social Worker

## 2022-01-26 DIAGNOSIS — F4323 Adjustment disorder with mixed anxiety and depressed mood: Secondary | ICD-10-CM | POA: Diagnosis not present

## 2022-01-26 NOTE — Progress Notes (Signed)
   THERAPIST PROGRESS NOTE  Virtual Visit via Video Note  I connected with Brittany Copeland on 01/26/22 at  1:00 PM EDT by a video enabled telemedicine application and verified that I am speaking with the correct person using two identifiers.  Location: Patient: [facility]  Provider: Provider Home    I discussed the limitations of evaluation and management by telemedicine and the availability of in person appointments. The patient expressed understanding and agreed to proceed.      I discussed the assessment and treatment plan with the patient. The patient was provided an opportunity to ask questions and all were answered. The patient agreed with the plan and demonstrated an understanding of the instructions.   The patient was advised to call back or seek an in-person evaluation if the symptoms worsen or if the condition fails to improve as anticipated.  I provided 40 minutes of non-face-to-face time during this encounter.   Brittany Horn, LCSW  Participation Level: Active  Behavioral Response: CasualAlertAnxious and Depressed  Type of Therapy: Individual Therapy  Treatment Goals addressed: Identify 3 triggers for anxiety  ProgressTowards Goals: Progressing  Interventions: CBT, Motivational Interviewing, and Supportive   Suicidal/Homicidal: Nowithout intent/plan  Therapist Response:    Pt was alert and oriented x 5. She was dressed casually and engaged well in therapy session. Pt presented with anxious mood/affect. She was pleasant, cooperative and maintained good eye contact.    Primary stressors are financials, school, and illness. Pt report that her daughter has been off and on sick for the past two months. She reports she has missed some time from school, and it has been challenging for the pt to manage. 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 Pt reports that her practice tests have been going well. Brittany Copeland states she did have to  delay scheduling because her water heater broke and that cost her $150. Pt reports increased stress as she needs to pass this test as she is currently unemployed after being laid off.   Interventions/plan: LCSW used supportive therapy for praise and encouragement. LCSW used empowerment for person centered therapy. LCSW used psychoanalytic therapy for pt to express her thoughts, feelings, and emotions. LCSW used positive affirmations for motivational interviewing. Plan for pt is to prioritize 1 coping skill per day for herself care.    Plan: Return again in 3 weeks.  Diagnosis: Adjustment disorder with mixed anxiety and depressed mood  Collaboration of Care: Other None   Patient/Guardian was advised Release of Information must be obtained prior to any record release in order to collaborate their care with an outside provider. Patient/Guardian was advised if they have not already done so to contact the registration department to sign all necessary forms in order for [place] to release information regarding their care.   Consent: Patient/Guardian gives verbal consent for treatment and assignment of benefits for services provided during this visit. Patient/Guardian expressed understanding and agreed to proceed.   Brittany Horn, LCSW 01/26/2022

## 2022-01-29 ENCOUNTER — Telehealth: Payer: Medicaid Other | Admitting: Physician Assistant

## 2022-01-29 DIAGNOSIS — J019 Acute sinusitis, unspecified: Secondary | ICD-10-CM

## 2022-01-29 DIAGNOSIS — B9689 Other specified bacterial agents as the cause of diseases classified elsewhere: Secondary | ICD-10-CM | POA: Diagnosis not present

## 2022-01-29 MED ORDER — DOXYCYCLINE HYCLATE 100 MG PO TABS: 100.0000 mg | ORAL_TABLET | Freq: Two times a day (BID) | ORAL | 0 refills | Status: DC

## 2022-01-29 NOTE — Progress Notes (Signed)
I have spent 5 minutes in review of e-visit questionnaire, review and updating patient chart, medical decision making and response to patient.   Grecia Lynk Cody Dick Hark, PA-C    

## 2022-01-29 NOTE — Progress Notes (Signed)

## 2022-02-02 MED ORDER — FLUCONAZOLE 150 MG PO TABS: ORAL_TABLET | ORAL | 0 refills | Status: DC

## 2022-02-02 NOTE — Addendum Note (Signed)
Addended by: Brunetta Jeans on: 02/02/2022 01:12 PM   Modules accepted: Orders

## 2022-02-18 ENCOUNTER — Ambulatory Visit (INDEPENDENT_AMBULATORY_CARE_PROVIDER_SITE_OTHER): Payer: Medicaid Other | Admitting: Licensed Clinical Social Worker

## 2022-02-18 DIAGNOSIS — F4323 Adjustment disorder with mixed anxiety and depressed mood: Secondary | ICD-10-CM | POA: Diagnosis not present

## 2022-02-18 NOTE — Progress Notes (Signed)
   THERAPIST PROGRESS NOTE  Virtual Visit via Video Note  I connected with [name] on 02/18/22 at 10:00 AM EST by a video enabled telemedicine application and verified that I am speaking with the correct person using two identifiers.  Location: Patient: [facility]  Provider: Providers Home    I discussed the limitations of evaluation and management by telemedicine and the availability of in person appointments. The patient expressed understanding and agreed to proceed.      I discussed the assessment and treatment plan with the patient. The patient was provided an opportunity to ask questions and all were answered. The patient agreed with the plan and demonstrated an understanding of the instructions.   The patient was advised to call back or seek an in-person evaluation if the symptoms worsen or if the condition fails to improve as anticipated.  I provided 30 minutes of non-face-to-face time during this encounter.   Dory Horn, LCSW   Participation Level: Active  Behavioral Response: CasualAlertAnxious and Depressed  Type of Therapy: Individual Therapy  Treatment Goals addressed: Identify 3 triggers for anxiety   ProgressTowards Goals: Progressing  Interventions: CBT, Motivational Interviewing, and Supportive   Suicidal/Homicidal: Nowithout intent/plan  Therapist Response:    Pt was alert and oriented x 5. She was dressed casually and engaged well in therapy session. Pt presented with depressed and anxious mood/affect. She was pleasant, cooperative and maintained good eye contact.   Pt came into session today with stressor for work, financials, and ex relationship. Pt reports that her ex-fianc is attempting to contact her. He keeps on using applications that disguise his real number. Pt reports solution for this will be to get number changes as she is fearful that he will just show up to her house. Pt states she has not had any communication with him  but knows it is him that keeps calling. Other stressor for pt are financials and work. 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 Pt reports tension and worry as other people she knows have failed several times. Pt also reports family stress for stepfather who has dementia and can be verbally aggressive and a family member recently getting a liver transplant.   Intervention/Plan: LCSW used psychoanalytic therapy for pt to express thoughts, feelings and emotions in session. LCSW used interventions for reflective listening, positive affirmations, and open-ended questions. LCSW used unconditional positive regard for person centered therapy.   Plan: Return again in 3 weeks.  Diagnosis: Adjustment disorder with mixed anxiety and depressed mood  Collaboration of Care: Other None today   Patient/Guardian was advised Release of Information must be obtained prior to any record release in order to collaborate their care with an outside provider. Patient/Guardian was advised if they have not already done so to contact the registration department to sign all necessary forms in order for [place] to release information regarding their care.   Consent: Patient/Guardian gives verbal consent for treatment and assignment of benefits for services provided during this visit. Patient/Guardian expressed understanding and agreed to proceed.   Dory Horn, LCSW 02/18/2022

## 2022-03-03 ENCOUNTER — Ambulatory Visit: Payer: Medicaid Other | Admitting: Dermatology

## 2022-03-03 VITALS — BP 132/80 | HR 79

## 2022-03-03 DIAGNOSIS — L720 Epidermal cyst: Secondary | ICD-10-CM

## 2022-03-03 DIAGNOSIS — L309 Dermatitis, unspecified: Secondary | ICD-10-CM

## 2022-03-03 DIAGNOSIS — D229 Melanocytic nevi, unspecified: Secondary | ICD-10-CM

## 2022-03-03 DIAGNOSIS — L820 Inflamed seborrheic keratosis: Secondary | ICD-10-CM | POA: Diagnosis not present

## 2022-03-03 DIAGNOSIS — L814 Other melanin hyperpigmentation: Secondary | ICD-10-CM

## 2022-03-03 DIAGNOSIS — I781 Nevus, non-neoplastic: Secondary | ICD-10-CM

## 2022-03-03 NOTE — Patient Instructions (Addendum)
Cryotherapy Aftercare  Wash gently with soap and water everyday.   Apply Vaseline and Band-Aid daily until healed.   Due to recent changes in healthcare laws, you may see results of your pathology and/or laboratory studies on MyChart before the doctors have had a chance to review them. We understand that in some cases there may be results that are confusing or concerning to you. Please understand that not all results are received at the same time and often the doctors may need to interpret multiple results in order to provide you with the best plan of care or course of treatment. Therefore, we ask that you please give [place] 2 business days to thoroughly review all your results before contacting the office for clarification. Should we see a critical lab result, you will be contacted sooner.   Seborrheic Keratosis  What causes seborrheic keratoses? Seborrheic keratoses are harmless, common skin growths that first appear during adult life.  As time goes by, more growths appear.  Some people may develop a large number of them.  Seborrheic keratoses appear on both covered and uncovered body parts.  They are not caused by sunlight.  The tendency to develop seborrheic keratoses can be inherited.  They vary in color from skin-colored to gray, brown, or even black.  They can be either smooth or have a rough, warty surface.   Seborrheic keratoses are superficial and look as if they were stuck on the skin.  Under the microscope this type of keratosis looks like layers upon layers of skin.  That is why at times the top layer may seem to fall off, but the rest of the growth remains and re-grows.    Treatment Seborrheic keratoses do not need to be treated, but can easily be removed in the office.  Seborrheic keratoses often cause symptoms when they rub on clothing or jewelry.  Lesions can be in the way of shaving.  If they become inflamed, they can cause itching, soreness, or burning.  Removal of a seborrheic  keratosis can be accomplished by freezing, burning, or surgery. If any spot bleeds, scabs, or grows rapidly, please return to have it checked, as these can be an indication of a skin cancer.  Can you use over the counter hydrocortisone cream as needed for the irritation on breast.  If You Need Anything After Your Visit  If you have any questions or concerns for your doctor, please call our main line at [phone] and press option 4 to reach your doctor's medical assistant. If no one answers, please leave a voicemail as directed and we will return your call as soon as possible. Messages left after 4 pm will be answered the following business day.   You may also send [place] a message via [place]. We typically respond to MyChart messages within 1-2 business days.  For prescription refills, please ask your pharmacy to contact our office. Our fax number is [phone].  If you have an urgent issue when the clinic is closed that cannot wait until the next business day, you can page your doctor at the number below.    Please note that while we do our best to be available for urgent issues outside of office hours, we are not available 24/7.   If you have an urgent issue and are unable to reach [place], you may choose to seek medical care at your doctor's office, retail clinic, urgent care center, or emergency room.  If you have a medical emergency, please immediately call 911  or go to the emergency department.  Pager Numbers  - Dr. Nehemiah Massed: [phone]  - Dr. Laurence Ferrari: [phone]  - Dr. Nicole Kindred: [phone]  In the event of inclement weather, please call our main line at [phone] for an update on the status of any delays or closures.  Dermatology Medication Tips: Please keep the boxes that topical medications come in in order to help keep track of the instructions about where and how to use these. Pharmacies typically print the medication instructions only on the boxes and not directly on the  medication tubes.   If your medication is too expensive, please contact our office at [phone] option 4 or send [place] a message through [place].   We are unable to tell what your co-pay for medications will be in advance as this is different depending on your insurance coverage. However, we may be able to find a substitute medication at lower cost or fill out paperwork to get insurance to cover a needed medication.   If a prior authorization is required to get your medication covered by your insurance company, please allow [place] 1-2 business days to complete this process.  Drug prices often vary depending on where the prescription is filled and some pharmacies may offer cheaper prices.  The website www.goodrx.com contains coupons for medications through different pharmacies. The prices here do not account for what the cost may be with help from insurance (it may be cheaper with your insurance), but the website can give you the price if you did not use any insurance.  - You can print the associated coupon and take it with your prescription to the pharmacy.  - You may also stop by our office during regular business hours and pick up a GoodRx coupon card.  - If you need your prescription sent electronically to a different pharmacy, notify our office through [facility] or by phone at [phone] option 4.     Si Usted Necesita Algo Despus de Su Visita  Tambin puede enviarnos un mensaje a travs de Pharmacist, community. Por lo general respondemos a los mensajes de MyChart en el transcurso de 1 a 2 das hbiles.  Para renovar recetas, por favor pida a su farmacia que se ponga en contacto con nuestra oficina. Harland Dingwall de fax es [place] [phone].  Si tiene un asunto urgente cuando la clnica est cerrada y que no puede esperar hasta el siguiente da hbil, puede llamar/localizar a su doctor(a) al nmero que aparece a continuacin.   Por favor, tenga en cuenta que aunque hacemos todo lo posible  para estar disponibles para asuntos urgentes fuera del horario de [place], no estamos disponibles las 24 horas del da, los 7 das de la [place].   Si tiene un problema urgente y no puede comunicarse con nosotros, puede optar por buscar atencin mdica  en el consultorio de su doctor(a), en una clnica privada, en un centro de atencin urgente o en una sala de emergencias.  Si tiene Engineering geologist, por favor llame inmediatamente al 911 o vaya a la sala de emergencias.  Nmeros de bper  - Dr. Nehemiah Massed: [phone]  - Dra. Moye: [phone]  - Dra. Nicole Kindred: [phone]  En caso de inclemencias del [place], por favor llame a Johnsie Kindred principal al [phone] para una actualizacin sobre el [place] de cualquier retraso o cierre.  Consejos para la medicacin en dermatologa: Por favor, guarde las cajas en las que vienen los medicamentos de uso tpico para ayudarle a seguir las instrucciones sobre dnde  y cmo usarlos. Las farmacias generalmente imprimen las instrucciones del medicamento slo en las cajas y no directamente en los tubos del [place].   Si su medicamento es muy caro, por favor, pngase en contacto con Zigmund Daniel llamando al [phone] y presione la opcin 4 o envenos un mensaje a travs de Pharmacist, community.   No podemos decirle cul ser su copago por los medicamentos por adelantado ya que esto es diferente dependiendo de la cobertura de su seguro. Sin embargo, es posible que podamos encontrar un medicamento sustituto a Electrical engineer un formulario para que el seguro cubra el medicamento que se considera necesario.   Si se requiere una autorizacin previa para que su compaa de seguros [place] su medicamento, por favor permtanos de 1 a 2 das hbiles para completar este proceso.  Los precios de los medicamentos varan con frecuencia dependiendo del Environmental consultant de dnde se surte la receta y alguna farmacias pueden ofrecer precios ms baratos.  El sitio web  www.goodrx.com tiene cupones para medicamentos de Airline pilot. Los precios aqu no tienen en cuenta lo que podra costar con la ayuda del seguro (puede ser ms barato con su seguro), pero el sitio web puede darle el precio si no utiliz Research scientist (physical sciences).  - Puede imprimir el cupn correspondiente y llevarlo con su receta a la farmacia.  - Tambin puede pasar por nuestra oficina durante el horario de atencin regular y Charity fundraiser una tarjeta de cupones de GoodRx.  - Si necesita que su receta se enve electrnicamente a una farmacia diferente, informe a nuestra oficina a travs de MyChart de [place] o por telfono llamando al [phone] y presione la opcin 4.

## 2022-03-03 NOTE — Progress Notes (Signed)
   New Patient Visit  Subjective  Brittany Copeland is a 36 y.o. female who presents for the following: check spot (L nipple, hx of black spot and 2 white spots, ~2m everything has resolved except one of the white spots, hx of sharp pain has not had in about a month, pt had a mammogram, ultra sound and one other test ) and check spots (Back, 5 yrs, itchy prn/Upper lip, 7 yrs, no changes).  New patient referral from Dr. MLoura Pardon  The following portions of the chart were reviewed this encounter and updated as appropriate:   Tobacco  Allergies  Meds  Problems  Med Hx  Surg Hx  Fam Hx     Review of Systems:  No other skin or systemic complaints except as noted in HPI or Assessment and Plan.  Objective  Well appearing patient in no apparent distress; mood and affect are within normal limits.  A focused examination was performed including left breast, back, face. Relevant physical exam findings are noted in the Assessment and Plan.  L nipple Cystic pap  Left Breast x 1, R mid back x 1 (2) Stuck on waxy paps with erythema  R of midline upper lip at vermillion border Dilated vessel, blanches by diascopy  Left Breast Clear today   Assessment & Plan   Melanocytic Nevi - Tan-brown and/or pink-flesh-colored symmetric macules and papules - Benign appearing on exam today - Observation - Call clinic for new or changing moles - Recommend daily use of broad spectrum spf 30+ sunscreen to sun-exposed areas.    Lentigines - Scattered tan macules - Due to sun exposure - Benign-appearing, observe - Recommend daily broad spectrum sunscreen SPF 30+ to sun-exposed areas, reapply every 2 hours as needed. - Call for any changes   Epidermal cyst L nipple Benign appearing, observe  Inflamed seborrheic keratosis (2) Left Breast x 1, R mid back x 1 Symptomatic, irritating, patient would like treated. Destruction of lesion - Left Breast x 1, R mid back x 1 Complexity: simple    Destruction method: cryotherapy   Informed consent: discussed and consent obtained   Timeout:  patient name, date of birth, surgical site, and procedure verified Lesion destroyed using liquid nitrogen: Yes   Region frozen until ice ball extended beyond lesion: Yes   Outcome: patient tolerated procedure well with no complications   Post-procedure details: wound care instructions given    Telangiectasia R of midline upper lip at vermillion border Benign, observe.    Dermatitis Left Breast May use otc HC cream as needed for flares  Return if symptoms worsen or fail to improve.  I, SOthelia Pulling RMA, am acting as scribe for DSarina Ser MD . Documentation: I have reviewed the above documentation for accuracy and completeness, and I agree with the above.  DSarina Ser MD

## 2022-03-11 ENCOUNTER — Ambulatory Visit (HOSPITAL_COMMUNITY): Payer: Medicaid Other | Admitting: Licensed Clinical Social Worker

## 2022-03-11 ENCOUNTER — Telehealth: Payer: Self-pay | Admitting: *Deleted

## 2022-03-11 NOTE — Telephone Encounter (Signed)
Please ask her if she is open to trying the XL - it lasts longer so dosed once daily instead of twice daily  I think she would take the 300

## 2022-03-11 NOTE — Telephone Encounter (Signed)
**  FAX REGARDING BUPROPION 150 MG**  Received fax from [employer] saying:  Drug not covered by pt's plan. The preferred alternative is BUPROPN HCL TABMGXL, BUPROPION TAB MG  ~[facility] [address]

## 2022-03-11 NOTE — Telephone Encounter (Signed)
Left VM requesting pt to call the office back 

## 2022-03-12 MED ORDER — BUPROPION HCL ER (XL) 300 MG PO TB24: 300.0000 mg | ORAL_TABLET | Freq: Every day | ORAL | 2 refills | Status: DC

## 2022-03-12 NOTE — Telephone Encounter (Signed)
Patient states she is willing to take the XL once daily if you would call that in please.

## 2022-03-12 NOTE — Telephone Encounter (Signed)
Left message for Patient to call the office back 

## 2022-03-12 NOTE — Telephone Encounter (Signed)
Patient called and stated she was returning a phone call.

## 2022-03-14 ENCOUNTER — Encounter: Payer: Self-pay | Admitting: Dermatology

## 2022-03-19 ENCOUNTER — Ambulatory Visit (INDEPENDENT_AMBULATORY_CARE_PROVIDER_SITE_OTHER): Payer: Medicaid Other | Admitting: Licensed Clinical Social Worker

## 2022-03-19 DIAGNOSIS — F4323 Adjustment disorder with mixed anxiety and depressed mood: Secondary | ICD-10-CM | POA: Diagnosis not present

## 2022-03-19 DIAGNOSIS — F329 Major depressive disorder, single episode, unspecified: Secondary | ICD-10-CM | POA: Diagnosis not present

## 2022-03-19 NOTE — Progress Notes (Signed)
   THERAPIST PROGRESS NOTE  Virtual Visit via Video Note  I connected with Brittany Copeland on 03/19/22 at  9:00 AM EST by a video enabled telemedicine application and verified that I am speaking with the correct person using two identifiers.  Location: Patient: [facility]  Provider: Provider Home    I discussed the limitations of evaluation and management by telemedicine and the availability of in person appointments. The patient expressed understanding and agreed to proceed.      I discussed the assessment and treatment plan with the patient. The patient was provided an opportunity to ask questions and all were answered. The patient agreed with the plan and demonstrated an understanding of the instructions.   The patient was advised to call back or seek an in-person evaluation if the symptoms worsen or if the condition fails to improve as anticipated.  I provided 40 minutes of non-face-to-face time during this encounter.   Brittany Horn, LCSW   Participation Level: Active  Behavioral Response: CasualAlertAnxious and Depressed  Type of Therapy: Individual Therapy  Treatment Goals addressed: workout 3 x weekly    ProgressTowards Goals: Progressing  Interventions: Motivational Interviewing, Strength-based, Supportive, and Reframing   Suicidal/Homicidal: Nowithout intent/plan  Therapist Response:   Pt states "All the things" happened since the last time we talked. 1. Stepfather collapsed 2. Handling daughter as stepfather goes through illness 3. Ex significant other4. Uncle rejecting liver transplant and 5. Taking Nursing exam. Pt reports that she has not been able to process through her stress as she has been trying to support her mother and daughter. Brittany Copeland states stress, tension and worry for her loved ones. Pt reports that her mother feels like they are discharging her spouse unreasonably from the hospital. They are looking for care outside of the [facility] health  network because of it.  Pt reports that she has been attempting to prioritize herself but there are not enough hours in the day between supporting her mother and being a single mother. Pt reports wanting to work out.   Intervention/Plan: LCSW spoke with pt about working out 1 x weekly for hour. LCSW used paise and encouragement for going to the gym. LCSW educated pt about exercise and the effect it can have to decrease anxiety. LCSW used psychoanalytic therapy for pt to express thoughts, feeling and emotions in session.     Plan: Return again in 3 weeks.  Diagnosis: Adjustment disorder with mixed anxiety and depressed mood  Reactive depression  Collaboration of Care: Other None today   Patient/Guardian was advised Release of Information must be obtained prior to any record release in order to collaborate their care with an outside provider. Patient/Guardian was advised if they have not already done so to contact the registration department to sign all necessary forms in order for [place] to release information regarding their care.   Consent: Patient/Guardian gives verbal consent for treatment and assignment of benefits for services provided during this visit. Patient/Guardian expressed understanding and agreed to proceed.   Brittany Horn, LCSW 03/19/2022

## 2022-04-12 ENCOUNTER — Ambulatory Visit (INDEPENDENT_AMBULATORY_CARE_PROVIDER_SITE_OTHER): Payer: Medicaid Other | Admitting: Licensed Clinical Social Worker

## 2022-04-12 DIAGNOSIS — F4323 Adjustment disorder with mixed anxiety and depressed mood: Secondary | ICD-10-CM
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
## 2022-04-21 ENCOUNTER — Encounter: Payer: Self-pay | Admitting: Family Medicine

## 2022-04-21 ENCOUNTER — Telehealth (INDEPENDENT_AMBULATORY_CARE_PROVIDER_SITE_OTHER): Payer: Medicaid Other | Admitting: Family Medicine

## 2022-04-21 VITALS — Temp 98.2°F | Ht 67.5 in | Wt 155.0 lb

## 2022-04-21 DIAGNOSIS — K900 Celiac disease: Secondary | ICD-10-CM

## 2022-04-21 DIAGNOSIS — R152 Fecal urgency: Secondary | ICD-10-CM | POA: Diagnosis not present

## 2022-04-21 NOTE — Assessment & Plan Note (Signed)
In pt with celiac New since the holidays (had uri, diet change and stress at the time)  Cannot rule out a cold med with gluten but just not sure   Stools can be soft to normal  Some low abd cramping on /off  No blood/fever/abd pain or other symptoms   Disc poss of IBS and also lactose intol  Inst to hold dairy of any kind for 2 wk  Change her probiotic brand (try align) Start a gluten free fiber supplement  Continue good produce intake   If not imp in 2 weeks f/u in office May need to re est with GI eventually

## 2022-04-21 NOTE — Assessment & Plan Note (Signed)
Years since GI eval Colonoscopy years ago  Saw Dr Fuller Plan in 2012 ?   Stays gluten free

## 2022-04-21 NOTE — Progress Notes (Signed)
Virtual Visit via Video Note  I connected with Brittany Copeland on 04/21/22 at  9:30 AM EST by a video enabled telemedicine application and verified that I am speaking with the correct person using two identifiers.  Location: Patient: home Provider: office    I discussed the limitations of evaluation and management by telemedicine and the availability of in person appointments. The patient expressed understanding and agreed to proceed.  Parties involved in encounter  Patient: Brittany Copeland  Provider:  Loura Pardon MD   History of Present Illness: Pt presents to discuss GI issues   over xmas had uri  Got better a week later No abx since late nov  Did just finish school/had stress and exams  3 d later started having stomach issues    Now ongoing for about 4 weeks   Cramping  (very low pressure - more GI than gyn however)  Urgency to have a stool  Stool can be soft to normal  No blood No mucous  No change in odor   No abd pain  No tenderness   Eats pretty healthy in general   Does not take fiber supplements Lots of produce  She took a probiotic for a while (one now is kira brand and does not help)    Had slight nausea twice briefly  No chance pregnant -has iud  Does not get regular periods with iud   (occ spotting)   Did have some milk around xmas  Eats some goat cheese/feta and occ ice cream   She has a h/o celiac disease  This feels different  Very strict gluten free  With celiac gets vomiting and more constipation also   2012 saw Dr Fuller Plan - GI  May have seen someone else with [facility] in the interim  Colonoscopy was many years ago   Fam hx Uncle - IBS Cousin may have celiac     She was briefly lactose intol when pregnant and got better   Patient Active Problem List   Diagnosis Date Noted   Defecation urgency 04/21/2022   Adjustment disorder with mixed anxiety and depressed mood 11/18/2021   Reactive depression 09/16/2021   Recurrent  pansinusitis 06/02/2017   Right knee pain 09/11/2013   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   Abnormal cervical Pap smear with positive HPV DNA test 04/25/2013   Celiac disease 04/19/2013   Routine general medical examination at a health care facility 04/18/2013   Encounter for routine gynecological examination 04/18/2013   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   MIGRAINE HEADACHE 07/20/2006   Past Medical History:  Diagnosis Date   Celiac disease    Headache    Vaginal Pap smear, abnormal    Past Surgical History:  Procedure Laterality Date   ADENOIDECTOMY     Social History   Tobacco Use   Smoking status: Never   Smokeless tobacco: Never  Substance Use Topics   Alcohol use: No    Alcohol/week: 0.0 standard drinks of alcohol   Drug use: No   Family History  Problem Relation Age of Onset   Heart disease Father    Hypertension Father    Seizures Mother    Heart disease Paternal Uncle    Diabetes Maternal Grandmother    Thyroid disease Maternal Grandmother    Cancer Paternal Grandfather    Allergies  Allergen Reactions   Pineapple Anaphylaxis   Augmentin [Amoxicillin-Pot Clavulanate] Nausea And Vomiting and Other (See Comments)    Has patient had a PCN reaction causing  immediate rash, facial/tongue/throat swelling, SOB or lightheadedness with hypotension: No Has patient had a PCN reaction causing severe rash involving mucus membranes or skin necrosis: No Has patient had a PCN reaction that required hospitalization No Has patient had a PCN reaction occurring within the last 10 years: Yes If all of the above answers are "NO", then may proceed with Cephalosporin use.    Flonase [Fluticasone Propionate]     Made pt feel flushed and face turned red   Gluten Meal Other (See Comments)    Pt has celiac disease.     Current Outpatient Medications on File Prior to Visit  Medication Sig Dispense Refill   ADVAIR DISKUS 250-50 MCG/ACT AEPB Inhale 1 puff into the lungs 2  (two) times daily as needed.     b complex vitamins capsule Take 1 capsule by mouth daily.     Biotin w/ Vitamins C & E (HAIR/SKIN/NAILS PO) Take by mouth.     buPROPion (WELLBUTRIN XL) 300 MG 24 hr tablet Take 1 tablet (300 mg total) by mouth daily. 90 tablet 2   Multiple Vitamin (MULTIVITAMIN) capsule Take 1 capsule by mouth daily.     Probiotic Product (PROBIOTIC DAILY PO) Take 2 tablets by mouth 2 (two) times a week.     No current facility-administered medications on file prior to visit.  Review of Systems  Constitutional:  Negative for chills, fever and malaise/fatigue.  HENT:  Negative for congestion, ear pain, sinus pain and sore throat.   Eyes:  Negative for blurred vision, discharge and redness.  Respiratory:  Negative for cough, shortness of breath and stridor.   Cardiovascular:  Negative for chest pain, palpitations and leg swelling.  Gastrointestinal:  Positive for diarrhea. Negative for abdominal pain, blood in stool, constipation, melena, nausea and vomiting.       Low abd cramping  Brief nausea now better  Soft stool on and off  Musculoskeletal:  Negative for myalgias.  Skin:  Negative for rash.  Neurological:  Negative for dizziness and headaches.  Psychiatric/Behavioral:         Mood is ok  Stress level is lower      Observations/Objective: Patient appears well, in no distress Weight is baseline  No facial swelling or asymmetry Normal voice-not hoarse and no slurred speech No obvious tremor or mobility impairment Moving neck and UEs normally Able to hear the call well  No cough or shortness of breath during interview  Talkative and mentally sharp with no cognitive changes No skin changes on face or neck , no rash or pallor Affect is normal    Assessment and Plan: Problem List Items Addressed This Visit       Digestive   Celiac disease    Years since GI eval Colonoscopy years ago  Saw Dr Fuller Plan in 2012 ?   Stays gluten free         Other    Defecation urgency - Primary    In pt with celiac New since the holidays (had uri, diet change and stress at the time)  Cannot rule out a cold med with gluten but just not sure   Stools can be soft to normal  Some low abd cramping on /off  No blood/fever/abd pain or other symptoms   Disc poss of IBS and also lactose intol  Inst to hold dairy of any kind for 2 wk  Change her probiotic brand (try align) Start a gluten free fiber supplement  Continue good produce intake  If not imp in 2 weeks f/u in office May need to re est with GI eventually        Follow Up Instructions: Eat dairy free for 2 weeks   Try a gluten free fiber supplement daily with lots of fluids  Change your probiotic (Align is a good brand)  If wymptoms do not improv in 2 weeks follow up in office   Update if not starting to improve in a week or if worsening   Watch for fever//abdominal pain/ blood in stool or any new symptoms    I discussed the assessment and treatment plan with the patient. The patient was provided an opportunity to ask questions and all were answered. The patient agreed with the plan and demonstrated an understanding of the instructions.   The patient was advised to call back or seek an in-person evaluation if the symptoms worsen or if the condition fails to improve as anticipated.    Loura Pardon, MD

## 2022-04-21 NOTE — Patient Instructions (Signed)
Eat dairy free for 2 weeks   Try a gluten free fiber supplement daily with lots of fluids  Change your probiotic (Align is a good brand)  If wymptoms do not improv in 2 weeks follow up in office   Update if not starting to improve in a week or if worsening   Watch for fever//abdominal pain/ blood in stool or any new symptoms

## 2022-05-13 ENCOUNTER — Ambulatory Visit (INDEPENDENT_AMBULATORY_CARE_PROVIDER_SITE_OTHER): Payer: Medicaid Other | Admitting: Licensed Clinical Social Worker

## 2022-05-13 DIAGNOSIS — F4323 Adjustment disorder with mixed anxiety and depressed mood: Secondary | ICD-10-CM | POA: Diagnosis not present

## 2022-05-13 NOTE — Progress Notes (Signed)
THERAPIST PROGRESS NOTE  Virtual Visit via Video Note  I connected with [name] on 05/13/22 at 10:00 AM EST by a video enabled telemedicine application and verified that I am speaking with the correct person using two identifiers.  Location: Patient: [facility]  Provider: Provider Home    I discussed the limitations of evaluation and management by telemedicine and the availability of in person appointments. The patient expressed understanding and agreed to proceed.     I discussed the assessment and treatment plan with the patient. The patient was provided an opportunity to ask questions and all were answered. The patient agreed with the plan and demonstrated an understanding of the instructions.   The patient was advised to call back or seek an in-person evaluation if the symptoms worsen or if the condition fails to improve as anticipated.  I provided 30 minutes of non-face-to-face time during this encounter.   Dory Horn, LCSW   Participation Level: Active  Behavioral Response: CasualAlertAnxious and Depressed  Type of Therapy: Individual Therapy  Treatment Goals addressed:  Active     Anxiety Disorder CCP Problem  1 adjustment disorder       workout 3 x weekly  (Progressing)     Start:  11/18/21    Expected End:  06/03/22         Identify 3 triggers for anxiety  (Progressing)     Start:  11/18/21    Expected End:  06/03/22       Goal Note     Work  Ex spouse          LTG: Patient will score less than 5 on the Generalized Anxiety Disorder 7 Scale (GAD-7) (Progressing)     Start:  11/18/21    Expected End:  06/03/22         STG: Patient will complete at least 80% of assigned homework (Progressing)     Start:  11/18/21    Expected End:  06/03/22         STG: Patient will practice problem solving skills 3 times per week for the next 4 weeks (Progressing)     Start:  11/18/21    Expected End:  06/03/22           Depression CCP  Problem  1 adjustment disorder      identify 3 triggers for depression  (Progressing)     Start:  11/18/21    Expected End:  06/03/22       Goal Note     Ex spouse          LTG: Colletta [place] "[name]" WILL SCORE LESS THAN 10 ON THE PATIENT HEALTH QUESTIONNAIRE (PHQ-9) (Progressing)     Start:  11/18/21    Expected End:  06/03/22              ProgressTowards Goals: Progressing  Interventions: CBT, Motivational Interviewing, and Supportive    Suicidal/Homicidal: Nowithout intent/plan  Therapist Response:   Pt was alert and oriented x 5. She was dressed casually and engaged well in therapy. [name] was pleasant, cooperative and maintained good eye contact. She presented with depressed and anxious mood/affect.   Pt reports primary stressor as pet, daughter, and work.  Pt states that her cat is going through heat currently and is spraying everything like her curtains. [name] states she wants to get the cat fixed, but it is very expensive. She reports that she has a voucher through [facility], but there is a waiting list  to use it. Another stressor for pt is work. 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 She must be in a certain work window as she is a single mother and has limited options for childcare.   Intervention/Plan: LCSW used supportive therapy for praise and encouragement. LCSW used psychoanalytic therapy for pt to express thoughts, feelings and concerns. LCSW used reflective listening and open-ended questions for motivational interviewing.       Plan: Return again in 3 weeks.  Diagnosis: Adjustment disorder with mixed anxiety and depressed mood  Collaboration of Care: Other None today   Patient/Guardian was advised Release of Information must be obtained prior to any record release in order to collaborate their care with an outside provider. Patient/Guardian was advised if they have not already done so to contact the  registration department to sign all necessary forms in order for [place] to release information regarding their care.   Consent: Patient/Guardian gives verbal consent for treatment and assignment of benefits for services provided during this visit. Patient/Guardian expressed understanding and agreed to proceed.   Dory Horn, LCSW 05/13/2022

## 2022-05-27 ENCOUNTER — Ambulatory Visit (HOSPITAL_COMMUNITY): Payer: Medicaid Other | Admitting: Licensed Clinical Social Worker

## 2022-06-17 ENCOUNTER — Ambulatory Visit (INDEPENDENT_AMBULATORY_CARE_PROVIDER_SITE_OTHER): Payer: Medicaid Other | Admitting: Licensed Clinical Social Worker

## 2022-06-17 DIAGNOSIS — F4323 Adjustment disorder with mixed anxiety and depressed mood: Secondary | ICD-10-CM

## 2022-06-17 NOTE — Progress Notes (Signed)
   THERAPIST PROGRESS NOTE Virtual Visit via Video Note  I connected with Brittany Copeland on 06/17/22 at 11:00 AM EDT by a video enabled telemedicine application and verified that I am speaking with the correct person using two identifiers.  Location: Patient: [facility]  Provider: Provider Home    I discussed the limitations of evaluation and management by telemedicine and the availability of in person appointments. The patient expressed understanding and agreed to proceed.    I discussed the assessment and treatment plan with the patient. The patient was provided an opportunity to ask questions and all were answered. The patient agreed with the plan and demonstrated an understanding of the instructions.   The patient was advised to call back or seek an in-person evaluation if the symptoms worsen or if the condition fails to improve as anticipated. I provided 45 minutes of non-face-to-face time during this encounter. Dory Horn, LCSW  Participation Level: Active Behavioral Response: CasualAlertAnxious and Depressed Type of Therapy: Individual Therapy Treatment Goals addressed:  Progress Towards Goals: Progressing Interventions: CBT, Motivational Interviewing, and Supportive  Suicidal/Homicidal: Nowithout intent/plan  Therapist Response:   Pt was alert and oriented x 5. She was dressed casually and engaged well in therapy session. Brittany Copeland presented with depressed and anxious mood/affect. Pt was pleasant, cooperative and maintained good eye contact.   Stressor for pt is work, school, and balancing family life. Pt was tearful in session endorsing symptoms for worry and sadness about being able to balance work and school with stressor of being a single parent. Brittany Copeland states "I know it is not rational, but I am worried". 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 Pt reports that she will have to set healthy  boundaries in order to manage her schedule and prioritize her daughter.   Interventions/Plan: LCSW used psychoanalytic therapy for pt to express thoughts, feeling and emotions. LCSW used motivational interviewing for open ended questions, positive affirmations, and reflective listening. LCSW educated pt on the use of planner to help organize time management better. LCSW educated pt on the benefits of setting healthy boundaries.    Plan: Return again in 3 weeks.  Diagnosis: Adjustment disorder with mixed anxiety and depressed mood  Collaboration of Care: Other None today   Patient/Guardian was advised Release of Information must be obtained prior to any record release in order to collaborate their care with an outside provider. Patient/Guardian was advised if they have not already done so to contact the registration department to sign all necessary forms in order for [place] to release information regarding their care.   Consent: Patient/Guardian gives verbal consent for treatment and assignment of benefits for services provided during this visit. Patient/Guardian expressed understanding and agreed to proceed.   Dory Horn, LCSW 06/17/2022

## 2022-06-29 ENCOUNTER — Telehealth: Payer: Self-pay | Admitting: Family Medicine

## 2022-06-29 NOTE — Telephone Encounter (Signed)
Patient called in and stated she needs a copy of her updated immunizations record. She stated she will like a call when this is ready. Thank you!

## 2022-06-30 NOTE — Telephone Encounter (Signed)
Pt notified copy ready for pick up 

## 2022-07-15 ENCOUNTER — Ambulatory Visit (INDEPENDENT_AMBULATORY_CARE_PROVIDER_SITE_OTHER): Payer: Medicaid Other | Admitting: Licensed Clinical Social Worker

## 2022-07-15 DIAGNOSIS — F4323 Adjustment disorder with mixed anxiety and depressed mood: Secondary | ICD-10-CM | POA: Diagnosis not present

## 2022-07-15 NOTE — Progress Notes (Signed)
THERAPIST PROGRESS NOTE  Virtual Visit via Video Note  I connected with Octaviano Batty on 07/15/22 at 10:00 AM EDT by a video enabled telemedicine application and verified that I am speaking with the correct person using two identifiers.  Location: Patient: [facility]  Provider: Providers Home    I discussed the limitations of evaluation and management by telemedicine and the availability of in person appointments. The patient expressed understanding and agreed to proceed.    I discussed the assessment and treatment plan with the patient. The patient was provided an opportunity to ask questions and all were answered. The patient agreed with the plan and demonstrated an understanding of the instructions.   The patient was advised to call back or seek an in-person evaluation if the symptoms worsen or if the condition fails to improve as anticipated.  I provided 45 minutes of non-face-to-face time during this encounter.   Weber Cooks, LCSW   Participation Level: Active  Behavioral Response: CasualAlertAnxious  Type of Therapy: Individual Therapy  Treatment Goals addressed:  Active     Anxiety Disorder CCP Problem  1 adjustment disorder       workout 3 x weekly  (Progressing)     Start:  11/18/21    Expected End:  01/07/23         Identify 3 triggers for anxiety  (Progressing)     Start:  11/18/21    Expected End:  01/07/23         LTG: Patient will score less than 5 on the Generalized Anxiety Disorder 7 Scale (GAD-7) (Progressing)     Start:  11/18/21    Expected End:  01/07/23         STG: Patient will complete at least 80% of assigned homework (Progressing)     Start:  11/18/21    Expected End:  01/07/23         STG: Patient will practice problem solving skills 3 times per week for the next 4 weeks (Progressing)     Start:  11/18/21    Expected End:  01/07/23           Depression CCP Problem  1 adjustment disorder      identify 3 triggers  for depression  (Progressing)     Start:  11/18/21    Expected End:  01/07/23         LTG: Judeth Cornfield "[name]" WILL SCORE LESS THAN 10 ON THE PATIENT HEALTH QUESTIONNAIRE (PHQ-9) (Progressing)     Start:  11/18/21    Expected End:  01/07/23            ProgressTowards Goals: Progressing  Interventions: CBT, Motivational Interviewing, and Reframing Suicidal/Homicidal: Nowithout intent/plan  Therapist Response:    Pt was alert and oriented x 5. She was dressed casually and engaged well in therapy session. She presented with anxious mood/affect. Eulah Citizen was pleasant, cooperative and maintained good eye contact.  Pt reports primary stressor is family conflict. She reports that her stepfather suffers from memory loss. He has been hospitalized multiple times for this and has Hx of being violent towards pt mother. Pt reports he was recently hospitalized due to being violent with pt mother and pt mother can no longer handle pt stepfather care at home. But hospital discharged him into mother's care. [name] reports tension and worry for her mother. Other stressors are school. She reports that she has found a job and will be starting that Monday. She reports worry due to add more  responsibility into her daily life.  Intervention/Plan: LCSW used supportive therapy for praise and encouragement. LCSW used psychoanalytic therapy for pt to express thoughts, feeling, and emotions. LCSW educated pt on Guardianship process to help with making decision for her stepfather's care. LCSW used person centered therapy for empowerment.   Plan: Return again in 3 weeks.  Diagnosis: Adjustment disorder with mixed anxiety and depressed mood  Collaboration of Care: Other None today   Patient/Guardian was advised Release of Information must be obtained prior to any record release in order to collaborate their care with an outside provider. Patient/Guardian was advised if they have not already done so to contact the  registration department to sign all necessary forms in order for [place] to release information regarding their care.   Consent: Patient/Guardian gives verbal consent for treatment and assignment of benefits for services provided during this visit. Patient/Guardian expressed understanding and agreed to proceed.   Weber Cooks, LCSW 07/15/2022

## 2022-08-05 ENCOUNTER — Ambulatory Visit (HOSPITAL_COMMUNITY): Payer: Medicaid Other | Admitting: Licensed Clinical Social Worker

## 2022-09-01 ENCOUNTER — Other Ambulatory Visit: Payer: 59

## 2022-09-09 ENCOUNTER — Encounter: Payer: 59 | Admitting: Family Medicine

## 2022-09-15 ENCOUNTER — Encounter: Payer: 59 | Admitting: Family Medicine

## 2022-09-24 ENCOUNTER — Encounter: Payer: Self-pay | Admitting: Family Medicine

## 2022-09-24 ENCOUNTER — Ambulatory Visit (INDEPENDENT_AMBULATORY_CARE_PROVIDER_SITE_OTHER): Payer: 59 | Admitting: Family Medicine

## 2022-09-24 VITALS — BP 128/72 | HR 80 | Temp 97.7°F | Ht 67.5 in | Wt 160.2 lb

## 2022-09-24 DIAGNOSIS — Z13220 Encounter for screening for lipoid disorders: Secondary | ICD-10-CM | POA: Diagnosis not present

## 2022-09-24 DIAGNOSIS — Z0000 Encounter for general adult medical examination without abnormal findings: Secondary | ICD-10-CM | POA: Diagnosis not present

## 2022-09-24 DIAGNOSIS — F4323 Adjustment disorder with mixed anxiety and depressed mood: Secondary | ICD-10-CM

## 2022-09-24 LAB — COMPREHENSIVE METABOLIC PANEL
ALT: 27 U/L (ref 0–35)
AST: 20 U/L (ref 0–37)
Albumin: 4.6 g/dL (ref 3.5–5.2)
Alkaline Phosphatase: 45 U/L (ref 39–117)
BUN: 14 mg/dL (ref 6–23)
CO2: 27 mEq/L (ref 19–32)
Calcium: 9.8 mg/dL (ref 8.4–10.5)
Chloride: 104 mEq/L (ref 96–112)
Creatinine, Ser: 0.77 mg/dL (ref 0.40–1.20)
GFR: 97.52 mL/min (ref >60.00)
Glucose, Bld: 91 mg/dL (ref 70–99)
Potassium: 4 mEq/L (ref 3.5–5.1)
Sodium: 137 mEq/L (ref 135–145)
Total Bilirubin: 0.8 mg/dL (ref 0.2–1.2)
Total Protein: 7.8 g/dL (ref 6.0–8.3)

## 2022-09-24 LAB — CBC WITH DIFFERENTIAL/PLATELET
Basophils Absolute: 0 10*3/uL (ref 0.0–0.1)
Basophils Relative: 0.4 % (ref 0.0–3.0)
Eosinophils Absolute: 0 10*3/uL (ref 0.0–0.7)
Eosinophils Relative: 0.5 % (ref 0.0–5.0)
HCT: 39.6 % (ref 36.0–46.0)
Hemoglobin: 13.3 g/dL (ref 12.0–15.0)
Lymphocytes Relative: 39.5 % (ref 12.0–46.0)
Lymphs Abs: 2.8 10*3/uL (ref 0.7–4.0)
MCHC: 33.4 g/dL (ref 30.0–36.0)
MCV: 89.6 fl (ref 78.0–100.0)
Monocytes Absolute: 0.3 10*3/uL (ref 0.1–1.0)
Monocytes Relative: 4.4 % (ref 3.0–12.0)
Neutro Abs: 4 10*3/uL (ref 1.4–7.7)
Neutrophils Relative %: 55.2 % (ref 43.0–77.0)
Platelets: 310 10*3/uL (ref 150.0–400.0)
RBC: 4.43 Mil/uL (ref 3.87–5.11)
RDW: 12.1 % (ref 11.5–15.5)
WBC: 7.2 10*3/uL (ref 4.0–10.5)

## 2022-09-24 LAB — TSH: TSH: 0.9 u[IU]/mL (ref 0.35–5.50)

## 2022-09-24 LAB — LIPID PANEL
Cholesterol: 176 mg/dL (ref 0–200)
HDL: 73.1 mg/dL (ref >39.00)
LDL Cholesterol: 85 mg/dL (ref 0–99)
NonHDL: 102.78
Total CHOL/HDL Ratio: 2
Triglycerides: 87 mg/dL (ref 0.0–149.0)
VLDL: 17.4 mg/dL (ref 0.0–40.0)

## 2022-09-24 NOTE — Assessment & Plan Note (Signed)
Reviewed health habits including diet and exercise and skin cancer prevention Reviewed appropriate screening tests for age  Also reviewed health mt list, fam hx and immunization status , as well as social and family history   See HPI Labs reviewed and ordered Had dx mammo in past - will plan first screening for 79  Sees Dr Vincente Poli for gyn -appt for pap and exam in August to get back on schedule  Encouraged strength building exercise and ca/D intake for bone health Utd derm care, encouraged sun protection  Sent for last tetanus shot date

## 2022-09-24 NOTE — Assessment & Plan Note (Signed)
Pt continues wellbutrin xl 300 mg daily which helps Also counseling  PHQ of 5 - primarily due to fatigue  Schedule overload-work/school/child care  Encouraged good self care when able

## 2022-09-24 NOTE — Patient Instructions (Addendum)
Add some strength training to your routine, this is important for bone and brain health and can reduce your risk of falls and help your body use insulin properly and regulate weight  Light weights, exercise bands , and internet videos are a good way to start  Yoga (chair or regular), machines , floor exercises or a gym with machines are also good options    Labs today   Use sun protection   Lab today   We will send for immunization update

## 2022-09-24 NOTE — Progress Notes (Signed)
Subjective:    Patient ID: Brittany Copeland, female    DOB: [birth year], 37 y.o.   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  Also has a young child    Immunization History  Administered Date(s) Administered   Td 04/06/1995    Health Maintenance Due  Topic Date Due   Hepatitis C Screening  Never done   DTaP/Tdap/Td (2 - Tdap) 04/05/2005   PAP SMEAR-Modifier  04/18/2016    Td 04/1995 Thinks she had one since at employee health- we sent for that report   Mammogram- had a diagnostic one last year for a lump/cyst , b9   Self breast exam: no lumps   Gyn care/ pap 04/2013 normal Remote history of HPV  Sees Dr Vincente Poli in August -for visit/pap    Colon cancer screening -will discussed at 43 No fam hx  Has celiac disease - had a colonoscopy years ago  Still stays gluten free  Also trying dairy free  Probiotics and fiber    Bone health  Falls- one, knee gave way  Fractures-none Supplements takes mvi and did take B complex  Exercise : -no program currently  Tries to fit things in   Keeps up with dermatology appts   Mood-history of dep/anx mood  Seeing a counselor  Takes wellbutrin xl 300 mg daily -continues to help     09/24/2022   10:58 AM 04/21/2022    9:22 AM 04/12/2022    1:29 PM 11/18/2021   10:14 AM  Depression screen PHQ 2/9  Decreased Interest 0 0    Down, Depressed, Hopeless 0 0    PHQ - 2 Score 0 0    Altered sleeping 2     Tired, decreased energy 1     Change in appetite 2     Feeling bad  or failure about yourself  0     Trouble concentrating 0     Moving slowly or fidgety/restless 0     Suicidal thoughts 0     PHQ-9 Score 5     Difficult doing work/chores Somewhat difficult        Information is confidential and restricted. Go to Review Flowsheets to unlock data.    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   MIGRAINE  HEADACHE 07/20/2006   Past Medical History:  Diagnosis Date   Allergy    Anemia    Anxiety    Arthritis    Asthma    Celiac disease    Depression    GERD (gastroesophageal reflux disease)    Headache    Vaginal Pap smear, abnormal    Past Surgical History:  Procedure Laterality Date   ADENOIDECTOMY     Social History   Tobacco Use   Smoking status: Never   Smokeless tobacco: Never  Substance Use Topics   Alcohol use: No   Drug use: No   Family History  Problem Relation Age of Onset   Seizures Mother    Heart disease Father    Hypertension Father    ADD / ADHD Father    Early death Father    Diabetes Maternal Grandmother    Thyroid disease Maternal Grandmother    Arthritis Maternal Grandmother    Hypothyroidism Maternal Grandmother    Cancer Paternal Grandfather    Heart disease Paternal Grandfather    Heart disease Paternal Uncle    Allergies  Allergen Reactions   Pineapple Anaphylaxis   Augmentin [Amoxicillin-Pot Clavulanate] Nausea And  Vomiting and Other (See Comments)    Has patient had a PCN reaction causing immediate rash, facial/tongue/throat swelling, SOB or lightheadedness with hypotension: No Has patient had a PCN reaction causing severe rash involving mucus membranes or skin necrosis: No Has patient had a PCN reaction that required hospitalization No Has patient had a PCN reaction occurring within the last 10 years: Yes If all of the above answers are "NO", then may proceed with Cephalosporin use.    Flonase [Fluticasone Propionate]     Made pt feel flushed and face turned red   Gluten Meal Other (See Comments)    Pt has celiac disease.     Current Outpatient Medications on File Prior to Visit  Medication Sig Dispense Refill   ADVAIR DISKUS 250-50 MCG/ACT AEPB Inhale 1 puff into the lungs 2 (two) times daily as needed.     b complex vitamins capsule Take 1 capsule by mouth daily.     Biotin w/ Vitamins C & E (HAIR/SKIN/NAILS PO) Take by mouth.     buPROPion (WELLBUTRIN XL) 300 MG 24 hr tablet Take 1 tablet (300 mg total) by mouth daily. 90 tablet 2   Multiple Vitamin (MULTIVITAMIN) capsule Take 1 capsule by mouth daily.     Probiotic Product (PROBIOTIC DAILY PO) Take 2 tablets by mouth 2 (two) times a week.     No current facility-administered medications on file prior to visit.    Review of Systems  Constitutional:  Negative for activity change, appetite change, fatigue, fever and unexpected weight change.  HENT:  Negative for congestion, ear pain, rhinorrhea, sinus pressure and sore throat.   Eyes:  Negative for pain, redness and visual disturbance.  Respiratory:  Negative for cough, shortness of breath and wheezing.   Cardiovascular:  Negative for chest pain and palpitations.  Gastrointestinal:  Negative for abdominal pain, blood in stool, constipation and diarrhea.  Endocrine: Negative for polydipsia and polyuria.  Genitourinary:  Negative for dysuria, frequency and urgency.  Musculoskeletal:  Negative  for arthralgias, back pain and myalgias.  Skin:  Negative for pallor and rash.  Allergic/Immunologic: Negative for environmental allergies.  Neurological:  Negative for dizziness, syncope and headaches.  Hematological:  Negative for adenopathy. Does not bruise/bleed easily.  Psychiatric/Behavioral:  Positive for dysphoric mood and sleep disturbance. Negative for  decreased concentration. The patient is nervous/anxious.        Mood is fair  Stressors are high        Objective:   Physical Exam Constitutional:      General: She is not in acute distress.    Appearance: Normal appearance. She is well-developed and normal weight. She is not ill-appearing or diaphoretic.  HENT:     Head: Normocephalic and atraumatic.     Right Ear: Tympanic membrane, ear canal and external ear normal.     Left Ear: Tympanic membrane, ear canal and external ear normal.     Nose: Nose normal. No congestion.     Mouth/Throat:     Mouth: Mucous membranes are moist.     Pharynx: Oropharynx is clear. No posterior oropharyngeal erythema.  Eyes:     General: No scleral icterus.    Extraocular Movements: Extraocular movements intact.     Conjunctiva/sclera: Conjunctivae normal.     Pupils: Pupils are equal, round, and reactive to light.  Neck:     Thyroid: No thyromegaly.     Vascular: No carotid bruit or JVD.  Cardiovascular:     Rate and Rhythm: Normal rate and regular rhythm.     Pulses: Normal pulses.     Heart sounds: Normal heart sounds.     No gallop.  Pulmonary:     Effort: Pulmonary effort is normal. No respiratory distress.     Breath sounds: Normal breath sounds. No wheezing.     Comments: Good air exch Chest:     Chest wall: No tenderness.  Abdominal:     General: Bowel sounds are normal. There is no distension or abdominal bruit.     Palpations: Abdomen is soft. There is no mass.     Tenderness: There is no abdominal tenderness.     Hernia: No hernia is present.  Genitourinary:     Comments: Breast and pelvic exam are done by gyn provider   Musculoskeletal:        General: No tenderness. Normal range of motion.     Cervical back: Normal range of motion and neck supple. No rigidity. No muscular tenderness.     Right lower leg: No edema.     Left lower leg: No edema.     Comments: No kyphosis   Lymphadenopathy:     Cervical: No cervical adenopathy.  Skin:    General: Skin is warm and dry.     Coloration: Skin is not pale.     Findings: No erythema or rash.     Comments: Solar lentigines diffusely  Area of scale on right back recently treated with cryo by derm  Fair   Neurological:     Mental Status: She is alert. Mental status is at baseline.     Cranial Nerves: No cranial nerve deficit.     Motor: No abnormal muscle tone.     Coordination: Coordination normal.     Gait: Gait normal.     Deep Tendon Reflexes: Reflexes are normal and symmetric.  Psychiatric:        Mood and Affect: Mood normal.        Cognition and Memory: Cognition and memory normal.           Assessment & Plan:   Problem List Items Addressed This Visit       Other   Routine general medical examination at a health care facility - Primary    Reviewed health habits including diet and exercise  and skin cancer prevention Reviewed appropriate screening tests for age  Also reviewed health mt list, fam hx and immunization status , as well as social and family history   See HPI Labs reviewed and ordered Had dx mammo in past - will plan first screening for 61  Sees Dr Vincente Poli for gyn -appt for pap and exam in August to get back on schedule  Encouraged strength building exercise and ca/D intake for bone health Utd derm care, encouraged sun protection  Sent for last tetanus shot date         Relevant Orders   TSH (Completed)   Lipid panel (Completed)   Comprehensive metabolic panel (Completed)   CBC with Differential/Platelet (Completed)   Adjustment disorder with mixed anxiety  and depressed mood    Pt continues wellbutrin xl 300 mg daily which helps Also counseling  PHQ of 5 - primarily due to fatigue  Schedule overload-work/school/child care  Encouraged good self care when able

## 2022-11-18 DIAGNOSIS — Z6826 Body mass index (BMI) 26.0-26.9, adult: Secondary | ICD-10-CM | POA: Diagnosis not present

## 2022-11-18 DIAGNOSIS — Z01419 Encounter for gynecological examination (general) (routine) without abnormal findings: Secondary | ICD-10-CM | POA: Diagnosis not present

## 2022-11-19 LAB — HM PAP SMEAR

## 2022-12-11 ENCOUNTER — Other Ambulatory Visit (HOSPITAL_BASED_OUTPATIENT_CLINIC_OR_DEPARTMENT_OTHER): Payer: Self-pay

## 2022-12-14 DIAGNOSIS — Z3202 Encounter for pregnancy test, result negative: Secondary | ICD-10-CM | POA: Diagnosis not present

## 2022-12-14 DIAGNOSIS — Z30433 Encounter for removal and reinsertion of intrauterine contraceptive device: Secondary | ICD-10-CM | POA: Diagnosis not present

## 2022-12-15 ENCOUNTER — Ambulatory Visit: Payer: 59 | Admitting: Family Medicine

## 2022-12-18 DIAGNOSIS — H5213 Myopia, bilateral: Secondary | ICD-10-CM | POA: Diagnosis not present

## 2023-01-19 ENCOUNTER — Ambulatory Visit (INDEPENDENT_AMBULATORY_CARE_PROVIDER_SITE_OTHER): Payer: 59 | Admitting: Family Medicine

## 2023-01-19 VITALS — BP 136/82 | HR 82 | Temp 98.9°F | Ht 67.5 in | Wt 179.1 lb

## 2023-01-19 DIAGNOSIS — M791 Myalgia, unspecified site: Secondary | ICD-10-CM | POA: Insufficient documentation

## 2023-01-19 DIAGNOSIS — Z8349 Family history of other endocrine, nutritional and metabolic diseases: Secondary | ICD-10-CM | POA: Insufficient documentation

## 2023-01-19 DIAGNOSIS — M2550 Pain in unspecified joint: Secondary | ICD-10-CM | POA: Diagnosis not present

## 2023-01-19 DIAGNOSIS — R635 Abnormal weight gain: Secondary | ICD-10-CM | POA: Diagnosis not present

## 2023-01-19 DIAGNOSIS — Z23 Encounter for immunization: Secondary | ICD-10-CM

## 2023-01-19 DIAGNOSIS — R519 Headache, unspecified: Secondary | ICD-10-CM | POA: Diagnosis not present

## 2023-01-19 DIAGNOSIS — F4320 Adjustment disorder, unspecified: Secondary | ICD-10-CM

## 2023-01-19 DIAGNOSIS — F4321 Adjustment disorder with depressed mood: Secondary | ICD-10-CM | POA: Diagnosis not present

## 2023-01-19 DIAGNOSIS — G4762 Sleep related leg cramps: Secondary | ICD-10-CM

## 2023-01-19 NOTE — Patient Instructions (Addendum)
Labs today  Watch for joint swelling or rash  Continue gluten free diet   Continue magnesium Try a teaspoon of mustard each evening   Stretch after exercise  Wear supportive shoes  Stay warm

## 2023-01-19 NOTE — Assessment & Plan Note (Signed)
Lost step father  Trying to care for mother  Reviewed stressors/ coping techniques/symptoms/ support sources/ tx options and side effects in detail today   Correlates with some physical /pain symptoms and fatigue and headaches

## 2023-01-19 NOTE — Assessment & Plan Note (Signed)
Noted recently   Discussed how this problem influences overall health and the risks it imposes  Reviewed plan for weight loss with lower calorie diet (via better food choices (lower glycemic and portion control) along with exercise building up to or more than 30 minutes 5 days per week including some aerobic activity and strength training   Unsure if flared by grief/stress reaction Lab today

## 2023-01-19 NOTE — Assessment & Plan Note (Signed)
ESR and crp added to labs Increase stressors and all over pain lately  Encouraged fluids Watch caffeine intake

## 2023-01-19 NOTE — Assessment & Plan Note (Signed)
With muscle pain/worse at night  Lab today  No acute joint changes today

## 2023-01-19 NOTE — Progress Notes (Signed)
Subjective:    Patient ID: Brittany Copeland, female    DOB: 08-Sep-1985, 37 y.o.   MRN: [id number]  HPI  Wt Readings from Last 3 Encounters:  01/19/23 179 lb 2 oz (81.3 kg)  09/24/22 160 lb 4 oz (72.7 kg)  04/21/22 155 lb (70.3 kg)   27.64 kg/m  Vitals:   01/19/23 1359  BP: 136/82  Pulse: 82  Temp: 98.9 F (37.2 C)  SpO2: 97%    Pt presents for c/o leg pain  Worse at night and walking up  Also weight gain Also flu shot   For 3 weeks    (her step father passed away)- so she thought it was stress  Trying to care for her mom and her daughter   Pain all over Mostly in her legs Mostly at night - lingers into the day   One day severe pain in back of calf in night - like a cramp  Was sore after wards  Has been up since 3 am with it   Has bad knees baseline  No new joint swelling  No redness or heat in joint   No new rashes   No fevers that she knows of  Does occational have a hot flash   No tick bites  Not outdoors a lot   Is working a lot   Appetite - comes and goes / overall eating normal  Has gained significant weight since feb of this year    Exercise-nothing new since July / was getting back into it  Pilates  15 lb weights  Tried to fit that in   Diet is pretty healthy  Occational eats drug rep lunch -usually eats a salad  She stays gluten free   Takes mvi  Balance diet  Powdered magnesium supplement - ? What kind   , about 1-2 weeks    Also some headache and neck pain   Mother with early menopause   GM hypothyroid Gm - arthritis      Lab Results  Component Value Date   WBC 7.2 09/24/2022   HGB 13.3 09/24/2022   HCT 39.6 09/24/2022   MCV 89.6 09/24/2022   PLT 310.0 09/24/2022    Lab Results  Component Value Date   NA 137 09/24/2022   K 4.0 09/24/2022   CO2 27 09/24/2022   GLUCOSE 91 09/24/2022   BUN 14 09/24/2022   CREATININE 0.77 09/24/2022   CALCIUM 9.8 09/24/2022   GFR 97.52 09/24/2022     Patient Active  Problem List   Diagnosis Date Noted   Nocturnal leg cramps 01/19/2023   Joint pain 01/19/2023   Weight gain 01/19/2023   Muscle pain 01/19/2023   Family history of early menopause 01/19/2023   Headache, chronic daily 01/19/2023   Grief reaction 01/19/2023   Defecation urgency 04/21/2022   Adjustment disorder with mixed anxiety and depressed mood 11/18/2021   Reactive depression 09/16/2021   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   Right knee pain 09/11/2013   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   Abnormal cervical Pap smear with positive HPV DNA test 04/25/2013   Celiac disease 04/19/2013   Routine general medical examination at a health care facility 04/18/2013   Encounter for routine gynecological examination 04/18/2013   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   Migraine headache 07/20/2006   Past Medical History:  Diagnosis Date   Allergy    Anemia    Anxiety    Arthritis    Asthma    Celiac disease  Depression    GERD (gastroesophageal reflux disease)    Headache    Vaginal Pap smear, abnormal    Past Surgical History:  Procedure Laterality Date   ADENOIDECTOMY     Social History   Tobacco Use   Smoking status: Never   Smokeless tobacco: Never  Substance Use Topics   Alcohol use: No   Drug use: No   Family History  Problem Relation Age of Onset   Seizures Mother    Heart disease Father    Hypertension Father    ADD / ADHD Father    Early death Father    Diabetes Maternal Grandmother    Thyroid disease Maternal Grandmother    Arthritis Maternal Grandmother    Hypothyroidism Maternal Grandmother    Cancer Paternal Grandfather    Heart disease Paternal Grandfather    Heart disease Paternal Uncle    Allergies  Allergen Reactions   Pineapple Anaphylaxis   Augmentin [Amoxicillin-Pot Clavulanate] Nausea And Vomiting and Other (See Comments)    Has patient had a PCN reaction causing immediate rash, facial/tongue/throat swelling, SOB or lightheadedness with  hypotension: No Has patient had a PCN reaction causing severe rash involving mucus membranes or skin necrosis: No Has patient had a PCN reaction that required hospitalization No Has patient had a PCN reaction occurring within the last 10 years: Yes If all of the above answers are "NO", then may proceed with Cephalosporin use.    Flonase [Fluticasone Propionate]     Made pt feel flushed and face turned red   Gluten Meal Other (See Comments)    Pt has celiac disease.     Current Outpatient Medications on File Prior to Visit  Medication Sig Dispense Refill   ADVAIR DISKUS 250-50 MCG/ACT AEPB Inhale 1 puff into the lungs 2 (two) times daily as needed.     b complex vitamins capsule Take 1 capsule by mouth daily.     Biotin w/ Vitamins C & E (HAIR/SKIN/NAILS PO) Take by mouth.     buPROPion (WELLBUTRIN XL) 300 MG 24 hr tablet Take 1 tablet (300 mg total) by mouth daily. 90 tablet 2   levonorgestrel (MIRENA, 52 MG,) 20 MCG/DAY IUD 1 each by Intrauterine route once.     Misc Natural Products (FIBER 7 PO) Take 1 tablet by mouth daily.     Multiple Vitamin (MULTIVITAMIN) capsule Take 1 capsule by mouth daily.     Probiotic Product (PROBIOTIC DAILY PO) Take 2 tablets by mouth 2 (two) times a week.     No current facility-administered medications on file prior to visit.    Review of Systems  Constitutional:  Positive for fatigue. Negative for activity change, appetite change, fever and unexpected weight change.  HENT:  Negative for congestion, ear pain, rhinorrhea, sinus pressure and sore throat.   Eyes:  Negative for pain, redness and visual disturbance.  Respiratory:  Negative for cough, shortness of breath and wheezing.   Cardiovascular:  Negative for chest pain and palpitations.  Gastrointestinal:  Negative for abdominal pain, blood in stool, constipation and diarrhea.  Endocrine: Negative for polydipsia and polyuria.  Genitourinary:  Negative for dysuria, frequency and urgency.   Musculoskeletal:  Positive for arthralgias, back pain, myalgias and neck pain. Negative for gait problem and joint swelling.  Skin:  Negative for pallor and rash.  Allergic/Immunologic: Negative for environmental allergies.  Neurological:  Positive for headaches. Negative for dizziness and syncope.  Hematological:  Negative for adenopathy. Does not bruise/bleed easily.  Psychiatric/Behavioral:  Positive for dysphoric mood. Negative for decreased concentration. The patient is nervous/anxious.        Grief        Objective:   Physical Exam Constitutional:      General: She is not in acute distress.    Appearance: Normal appearance. She is well-developed and normal weight. She is not ill-appearing or diaphoretic.  HENT:     Head: Normocephalic and atraumatic.     Right Ear: Tympanic membrane and ear canal normal.     Left Ear: Tympanic membrane and ear canal normal.     Mouth/Throat:     Mouth: Mucous membranes are moist.     Pharynx: Oropharynx is clear.  Eyes:     General: No scleral icterus.       Right eye: No discharge.        Left eye: No discharge.     Conjunctiva/sclera: Conjunctivae normal.     Pupils: Pupils are equal, round, and reactive to light.  Neck:     Thyroid: No thyromegaly.     Vascular: No carotid bruit or JVD.     Comments: Some trapezius tenderness bilaterally  Cardiovascular:     Rate and Rhythm: Normal rate and regular rhythm.     Heart sounds: Normal heart sounds.     No gallop.  Pulmonary:     Effort: Pulmonary effort is normal. No respiratory distress.     Breath sounds: Normal breath sounds. No wheezing or rales.  Abdominal:     General: There is no distension or abdominal bruit.     Palpations: Abdomen is soft.     Tenderness: There is no right CVA tenderness or left CVA tenderness.  Musculoskeletal:     Cervical back: Normal range of motion and neck supple. No rigidity.     Right lower leg: No edema.     Left lower leg: No edema.      Comments: Some trigger points /myofacial in upper back   No acute joint changes   Lymphadenopathy:     Cervical: No cervical adenopathy.  Skin:    General: Skin is warm and dry.     Coloration: Skin is not pale.     Findings: No rash.  Neurological:     Mental Status: She is alert.     Cranial Nerves: No cranial nerve deficit.     Motor: No weakness.     Coordination: Coordination normal.     Deep Tendon Reflexes: Reflexes are normal and symmetric. Reflexes normal.  Psychiatric:        Attention and Perception: Attention normal.        Mood and Affect: Mood is anxious.        Behavior: Behavior normal.        Cognition and Memory: Cognition and memory normal.     Comments: Mildly anxious Pleasant   Candidly discusses symptoms and stressors             Assessment & Plan:   Problem List Items Addressed This Visit       Other   Family history of early menopause    In mother  Pt is having some hot flashes Has mirena iud so seldom has period   Recent muscle/joint pain - ? If possibly hormonal change       Grief reaction    Lost step father  Trying to care for mother  Reviewed stressors/ coping techniques/symptoms/ support sources/ tx options and side effects in detail today  Correlates with some physical /pain symptoms and fatigue and headaches       Headache, chronic daily    ESR and crp added to labs Increase stressors and all over pain lately  Encouraged fluids Watch caffeine intake       Relevant Orders   Sedimentation Rate   C-reactive protein   Joint pain    With muscle pain/worse at night  Lab today  No acute joint changes today        Relevant Orders   CBC with Differential/Platelet   Comprehensive metabolic panel   ANA   Rheumatoid factor   Sedimentation Rate   C-reactive protein   Muscle pain    Lab today incl CPK , sed rate and crp Some trigger points in upper back  In setting of grief and stress       Relevant Orders   CK    Sedimentation Rate   C-reactive protein   Magnesium   Nocturnal leg cramps - Primary    Waking her up  Severe at times  Causing soreness afterwards   Lab today  Discussed stretching Discussed use of mustard, mag and tonic water  Handout given       Relevant Orders   CBC with Differential/Platelet   Comprehensive metabolic panel   TSH   VITAMIN D 25 Hydroxy (Vit-D Deficiency, Fractures)   Vitamin B12   CK   Magnesium   Weight gain    Noted recently   Discussed how this problem influences overall health and the risks it imposes  Reviewed plan for weight loss with lower calorie diet (via better food choices (lower glycemic and portion control) along with exercise building up to or more than 30 minutes 5 days per week including some aerobic activity and strength training   Unsure if flared by grief/stress reaction Lab today      Relevant Orders   Comprehensive metabolic panel   TSH   VITAMIN D 25 Hydroxy (Vit-D Deficiency, Fractures)   Other Visit Diagnoses     Need for influenza vaccination       Relevant Orders   Flu vaccine trivalent PF, 6mos and older(Flulaval,Afluria,Fluarix,Fluzone) (Completed)

## 2023-01-19 NOTE — Assessment & Plan Note (Signed)
In mother  Pt is having some hot flashes Has mirena iud so seldom has period   Recent muscle/joint pain - ? If possibly hormonal change

## 2023-01-19 NOTE — Assessment & Plan Note (Signed)
Waking her up  Severe at times  Causing soreness afterwards   Lab today  Discussed stretching Discussed use of mustard, mag and tonic water  Handout given

## 2023-01-19 NOTE — Assessment & Plan Note (Signed)
Lab today incl CPK , sed rate and crp Some trigger points in upper back  In setting of grief and stress

## 2023-01-20 ENCOUNTER — Encounter: Payer: Self-pay | Admitting: Family Medicine

## 2023-01-20 DIAGNOSIS — R7401 Elevation of levels of liver transaminase levels: Secondary | ICD-10-CM

## 2023-01-20 LAB — COMPREHENSIVE METABOLIC PANEL
ALT: 75 U/L — ABNORMAL HIGH (ref 0–35)
AST: 37 U/L (ref 0–37)
Albumin: 4.5 g/dL (ref 3.5–5.2)
Alkaline Phosphatase: 52 U/L (ref 39–117)
BUN: 10 mg/dL (ref 6–23)
CO2: 30 meq/L (ref 19–32)
Calcium: 10.1 mg/dL (ref 8.4–10.5)
Chloride: 102 meq/L (ref 96–112)
Creatinine, Ser: 0.69 mg/dL (ref 0.40–1.20)
GFR: 109.47 mL/min (ref >60.00)
Glucose, Bld: 88 mg/dL (ref 70–99)
Potassium: 4.2 meq/L (ref 3.5–5.1)
Sodium: 139 meq/L (ref 135–145)
Total Bilirubin: 0.6 mg/dL (ref 0.2–1.2)
Total Protein: 7.2 g/dL (ref 6.0–8.3)

## 2023-01-20 LAB — CK: Total CK: 110 U/L (ref 7–177)

## 2023-01-20 LAB — CBC WITH DIFFERENTIAL/PLATELET
Basophils Absolute: 0.1 10*3/uL (ref 0.0–0.1)
Basophils Relative: 0.8 % (ref 0.0–3.0)
Eosinophils Absolute: 0.1 10*3/uL (ref 0.0–0.7)
Eosinophils Relative: 0.7 % (ref 0.0–5.0)
HCT: 41.2 % (ref 36.0–46.0)
Hemoglobin: 13.2 g/dL (ref 12.0–15.0)
Lymphocytes Relative: 32 % (ref 12.0–46.0)
Lymphs Abs: 2.5 10*3/uL (ref 0.7–4.0)
MCHC: 32.1 g/dL (ref 30.0–36.0)
MCV: 92.6 fL (ref 78.0–100.0)
Monocytes Absolute: 0.5 10*3/uL (ref 0.1–1.0)
Monocytes Relative: 6 % (ref 3.0–12.0)
Neutro Abs: 4.8 10*3/uL (ref 1.4–7.7)
Neutrophils Relative %: 60.5 % (ref 43.0–77.0)
Platelets: 346 10*3/uL (ref 150.0–400.0)
RBC: 4.45 Mil/uL (ref 3.87–5.11)
RDW: 12.7 % (ref 11.5–15.5)
WBC: 7.9 10*3/uL (ref 4.0–10.5)

## 2023-01-20 LAB — MAGNESIUM: Magnesium: 1.9 mg/dL (ref 1.5–2.5)

## 2023-01-20 LAB — C-REACTIVE PROTEIN: CRP: <1 mg/dL (ref 0.5–20.0)

## 2023-01-20 LAB — VITAMIN B12: Vitamin B-12: 281 pg/mL (ref 211–911)

## 2023-01-20 LAB — TSH: TSH: 0.64 u[IU]/mL (ref 0.35–5.50)

## 2023-01-20 LAB — RHEUMATOID FACTOR: Rheumatoid fact SerPl-aCnc: <10 [IU]/mL (ref <14)

## 2023-01-20 LAB — SEDIMENTATION RATE: Sed Rate: 8 mm/h (ref 0–20)

## 2023-01-20 LAB — VITAMIN D 25 HYDROXY (VIT D DEFICIENCY, FRACTURES): VITD: 17.91 ng/mL — ABNORMAL LOW (ref 30.00–100.00)

## 2023-01-23 DIAGNOSIS — R7401 Elevation of levels of liver transaminase levels: Secondary | ICD-10-CM | POA: Insufficient documentation

## 2023-01-23 MED ORDER — ERGOCALCIFEROL 1.25 MG (50000 UT) PO CAPS: 50000.0000 [IU] | ORAL_CAPSULE | ORAL | 0 refills | Status: DC

## 2023-02-08 DIAGNOSIS — Z30431 Encounter for routine checking of intrauterine contraceptive device: Secondary | ICD-10-CM | POA: Diagnosis not present

## 2023-03-11 ENCOUNTER — Telehealth: Payer: 59 | Admitting: Physician Assistant

## 2023-03-11 DIAGNOSIS — B9689 Other specified bacterial agents as the cause of diseases classified elsewhere: Secondary | ICD-10-CM | POA: Diagnosis not present

## 2023-03-11 DIAGNOSIS — J019 Acute sinusitis, unspecified: Secondary | ICD-10-CM

## 2023-03-11 MED ORDER — DOXYCYCLINE HYCLATE 100 MG PO TABS
100.0000 mg | ORAL_TABLET | Freq: Two times a day (BID) | ORAL | 0 refills | Status: DC
Start: 2023-03-11 — End: 2023-11-22

## 2023-03-11 NOTE — Progress Notes (Signed)

## 2023-03-23 ENCOUNTER — Other Ambulatory Visit (INDEPENDENT_AMBULATORY_CARE_PROVIDER_SITE_OTHER): Payer: 59

## 2023-03-23 ENCOUNTER — Telehealth: Payer: Self-pay | Admitting: Family Medicine

## 2023-03-23 DIAGNOSIS — M2550 Pain in unspecified joint: Secondary | ICD-10-CM

## 2023-03-23 DIAGNOSIS — R7401 Elevation of levels of liver transaminase levels: Secondary | ICD-10-CM

## 2023-03-23 DIAGNOSIS — Z13220 Encounter for screening for lipoid disorders: Secondary | ICD-10-CM

## 2023-03-23 LAB — HEPATIC FUNCTION PANEL
ALT: 71 U/L — ABNORMAL HIGH (ref 0–35)
AST: 39 U/L — ABNORMAL HIGH (ref 0–37)
Albumin: 4.6 g/dL (ref 3.5–5.2)
Alkaline Phosphatase: 50 U/L (ref 39–117)
Bilirubin, Direct: 0.1 mg/dL (ref 0.0–0.3)
Total Bilirubin: 0.9 mg/dL (ref 0.2–1.2)
Total Protein: 7.3 g/dL (ref 6.0–8.3)

## 2023-03-23 NOTE — Addendum Note (Signed)
Addended by: Shon Millet on: 03/23/2023 02:35 PM   Modules accepted: Orders

## 2023-03-23 NOTE — Telephone Encounter (Signed)
That's great Please add  Unsure what dx to link it to  Can try lipid screen   Thanks

## 2023-03-23 NOTE — Telephone Encounter (Signed)
Lab added

## 2023-03-23 NOTE — Telephone Encounter (Signed)
Per lab we can add lipo a lab to blood work already collected.  Called pt and she works at [employer] and a provider there told her it may be a good idea to have it checked given all the sxs she is dealing with from Oct when she saw PC to discuss sxs she was having. 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 Pt is aware insurance may not cover it but still wants to get it done if PCP is okay ordering it.

## 2023-03-23 NOTE — Telephone Encounter (Signed)
Spoke with Brittany Copeland to schedule labs, scheduled for today @ 4pm. Brittany Copeland asked if Dr. Milinda Antis can submit a order for lipo a? If not, Brittany Copeland states she understands. Call back # [phone].

## 2023-03-23 NOTE — Telephone Encounter (Signed)
I think she already had her blood drawn -sorry  What was the lipo a for that she was interested in? I suspect it may not be covered unless she has history of high cholesterol

## 2023-03-25 LAB — ANTI-NUCLEAR AB-TITER (ANA TITER)
ANA TITER: 1:40 {titer} — ABNORMAL HIGH
ANA TITER: 1:40 {titer} — ABNORMAL HIGH
ANA Titer 1: 1:40 {titer} — ABNORMAL HIGH

## 2023-03-25 LAB — ACUTE HEP PANEL AND HEP B SURFACE AB
HEPATITIS C ANTIBODY REFILL$(REFL): NONREACTIVE
Hep A IgM: NONREACTIVE
Hep B C IgM: NONREACTIVE
Hepatitis B Surface Ag: NONREACTIVE

## 2023-03-25 LAB — REFLEX TIQ

## 2023-03-25 LAB — ANA: Anti Nuclear Antibody (ANA): POSITIVE — AB

## 2023-03-28 ENCOUNTER — Encounter: Payer: Self-pay | Admitting: Family Medicine

## 2023-03-28 DIAGNOSIS — R7401 Elevation of levels of liver transaminase levels: Secondary | ICD-10-CM

## 2023-03-28 DIAGNOSIS — R7689 Other specified abnormal immunological findings in serum: Secondary | ICD-10-CM

## 2023-03-28 DIAGNOSIS — R768 Other specified abnormal immunological findings in serum: Secondary | ICD-10-CM

## 2023-03-29 DIAGNOSIS — R768 Other specified abnormal immunological findings in serum: Secondary | ICD-10-CM | POA: Insufficient documentation

## 2023-03-29 LAB — LIPOPROTEIN A (LPA): Lipoprotein (a): <10 nmol/L (ref <75)

## 2023-04-08 ENCOUNTER — Encounter: Payer: Self-pay | Admitting: Gastroenterology

## 2023-04-15 ENCOUNTER — Other Ambulatory Visit: Payer: Self-pay | Admitting: Family Medicine

## 2023-04-15 NOTE — Telephone Encounter (Signed)
 Refill request for Vitamin D, Ergocalciferol, (DRISDOL) 1.25 MG (50000 UNIT) CAPS capsule   LOV - 01/19/23 Next OV - not scheduled Last refill - 01/23/23 #12/0 Last Lab done 01/19/23 and was 17.91 Next lab appt not scheduled yet

## 2023-04-15 NOTE — Telephone Encounter (Signed)
 Patient notified and rx denied.

## 2023-04-15 NOTE — Telephone Encounter (Signed)
 She can finish this 12 week course and be done  Continue 2000-4000 international units of vitamin D3 over the counter daily now

## 2023-05-01 ENCOUNTER — Other Ambulatory Visit (HOSPITAL_BASED_OUTPATIENT_CLINIC_OR_DEPARTMENT_OTHER): Payer: Self-pay

## 2023-05-01 MED ORDER — METRONIDAZOLE 0.75 % EX CREA
1.0000 | TOPICAL_CREAM | Freq: Two times a day (BID) | CUTANEOUS | 1 refills | Status: DC
  Filled 2023-05-01: qty 45, 23d supply, fill #0

## 2023-05-01 MED ORDER — DOXYCYCLINE HYCLATE 100 MG PO CAPS
100.0000 mg | ORAL_CAPSULE | Freq: Two times a day (BID) | ORAL | 1 refills | Status: DC
  Filled 2023-05-01: qty 90, 45d supply, fill #0

## 2023-05-05 ENCOUNTER — Other Ambulatory Visit: Payer: 59

## 2023-05-05 ENCOUNTER — Ambulatory Visit: Payer: 59 | Admitting: Gastroenterology

## 2023-05-05 ENCOUNTER — Encounter: Payer: Self-pay | Admitting: Gastroenterology

## 2023-05-05 ENCOUNTER — Other Ambulatory Visit (HOSPITAL_BASED_OUTPATIENT_CLINIC_OR_DEPARTMENT_OTHER): Payer: Self-pay

## 2023-05-05 VITALS — BP 118/82 | HR 87 | Ht 67.5 in | Wt 182.5 lb

## 2023-05-05 DIAGNOSIS — R52 Pain, unspecified: Secondary | ICD-10-CM | POA: Diagnosis not present

## 2023-05-05 DIAGNOSIS — F329 Major depressive disorder, single episode, unspecified: Secondary | ICD-10-CM | POA: Diagnosis not present

## 2023-05-05 DIAGNOSIS — Z724 Inappropriate diet and eating habits: Secondary | ICD-10-CM | POA: Diagnosis not present

## 2023-05-05 DIAGNOSIS — K589 Irritable bowel syndrome without diarrhea: Secondary | ICD-10-CM

## 2023-05-05 DIAGNOSIS — F4323 Adjustment disorder with mixed anxiety and depressed mood: Secondary | ICD-10-CM | POA: Diagnosis not present

## 2023-05-05 DIAGNOSIS — R635 Abnormal weight gain: Secondary | ICD-10-CM

## 2023-05-05 DIAGNOSIS — Z713 Dietary counseling and surveillance: Secondary | ICD-10-CM | POA: Diagnosis not present

## 2023-05-05 DIAGNOSIS — R7989 Other specified abnormal findings of blood chemistry: Secondary | ICD-10-CM

## 2023-05-05 LAB — CBC WITH DIFFERENTIAL/PLATELET
Basophils Absolute: 0 10*3/uL (ref 0.0–0.1)
Basophils Relative: 0.6 % (ref 0.0–3.0)
Eosinophils Absolute: 0 10*3/uL (ref 0.0–0.7)
Eosinophils Relative: 0.6 % (ref 0.0–5.0)
HCT: 40.3 % (ref 36.0–46.0)
Hemoglobin: 13.7 g/dL (ref 12.0–15.0)
Lymphocytes Relative: 38.2 % (ref 12.0–46.0)
Lymphs Abs: 2.3 10*3/uL (ref 0.7–4.0)
MCHC: 34 g/dL (ref 30.0–36.0)
MCV: 89 fL (ref 78.0–100.0)
Monocytes Absolute: 0.3 10*3/uL (ref 0.1–1.0)
Monocytes Relative: 5.2 % (ref 3.0–12.0)
Neutro Abs: 3.3 10*3/uL (ref 1.4–7.7)
Neutrophils Relative %: 55.4 % (ref 43.0–77.0)
Platelets: 292 10*3/uL (ref 150.0–400.0)
RBC: 4.53 Mil/uL (ref 3.87–5.11)
RDW: 12.3 % (ref 11.5–15.5)
WBC: 5.9 10*3/uL (ref 4.0–10.5)

## 2023-05-05 LAB — COMPREHENSIVE METABOLIC PANEL
ALT: 45 U/L — ABNORMAL HIGH (ref 0–35)
AST: 31 U/L (ref 0–37)
Albumin: 4.6 g/dL (ref 3.5–5.2)
Alkaline Phosphatase: 46 U/L (ref 39–117)
BUN: 10 mg/dL (ref 6–23)
CO2: 25 meq/L (ref 19–32)
Calcium: 9.7 mg/dL (ref 8.4–10.5)
Chloride: 105 meq/L (ref 96–112)
Creatinine, Ser: 0.68 mg/dL (ref 0.40–1.20)
GFR: 109.64 mL/min (ref >60.00)
Glucose, Bld: 99 mg/dL (ref 70–99)
Potassium: 4.1 meq/L (ref 3.5–5.1)
Sodium: 139 meq/L (ref 135–145)
Total Bilirubin: 0.7 mg/dL (ref 0.2–1.2)
Total Protein: 7.3 g/dL (ref 6.0–8.3)

## 2023-05-05 LAB — PROTIME-INR
INR: 1 {ratio} (ref 0.8–1.0)
Prothrombin Time: 11 s (ref 9.6–13.1)

## 2023-05-05 NOTE — Progress Notes (Signed)
Chief Complaint: Elevated LFTs Primary GI MD: Gentry Fitz  HPI: 38 year old female history of anxiety, celiac disease, GERD, presents for evaluation of abnormal LFTs.  01/19/2023: AST 37/ALT 75/alk phos 52/total bilirubin 0.6 03/23/2023: AST 39/ALT 71/alk phos 50/total bilirubin 0.9, hepatitis panel negative, ANA positive (1: 40 titer)  Discussed the use of AI scribe software for clinical note transcription with the patient, who gave verbal consent to proceed.  History of Present Illness   The patient presents with elevated liver enzymes. She was referred by her primary care provider for evaluation of elevated liver enzymes.  Elevated liver enzymes were initially discovered during routine blood tests. Further testing, including ANA, showed a low titer pattern. She has not started any new medications in the last six months, except for a Mirena replacement. She has abstained from alcohol since her symptoms began and rarely consumed it prior.  She has a history of celiac disease and irritable bowel syndrome (IBS), both generally well-controlled. She adheres to a gluten-free diet due to celiac disease and a lactose-free diet due to IBS, though she occasionally consumes dairy, which exacerbates her symptoms. She experiences irregular bowel movements, alternating between constipation and diarrhea, and takes a fiber supplement one to three times daily. She has noticed more improvement with a probiotic fiber supplement than with the pills.  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 Despite being more active and eating less due to work constraints, she has gained weight, which she finds frustrating.  She is concerned about potential early menopause, as her mother experienced early menopause. She has a Mirena, which complicates tracking menopausal symptoms.  She has not experienced menopausal symptoms but suspects it could be related to her weight gain and body aches.  She is a Engineer, civil (consulting) working in cardiology, attending school, and caring for a young child.     Past Medical History:  Diagnosis Date   Allergy    Anemia    Anxiety    Arthritis    Asthma    Celiac disease    Chronic headaches    Depression    GERD (gastroesophageal reflux disease)    Headache    Inflammatory bowel disease    Vaginal Pap smear, abnormal     Past Surgical History:  Procedure Laterality Date   ADENOIDECTOMY      Current Outpatient Medications  Medication Sig Dispense Refill   ADVAIR DISKUS 250-50 MCG/ACT AEPB Inhale 1 puff into the lungs 2 (two) times daily as needed.     b complex vitamins capsule Take 1 capsule by mouth daily.     Biotin w/ Vitamins C & E (HAIR/SKIN/NAILS PO) Take by mouth.     buPROPion (WELLBUTRIN XL) 300 MG 24 hr tablet Take 1 tablet (300 mg total) by mouth daily. 90 tablet 2   doxycycline (VIBRA-TABS) 100 MG tablet Take 1 tablet (100 mg total) by mouth 2 (two) times daily. 20 tablet 0   doxycycline (VIBRAMYCIN) 100 MG capsule Take 1 capsule (100 mg total) by mouth 2 (two) times daily for 6 weeks. 90 capsule 1   ergocalciferol (VITAMIN D2) 1.25 MG (50000 UT) capsule Take 1 capsule (50,000 Units total) by mouth once a week. 12 capsule 0   levonorgestrel (MIRENA, 52 MG,) 20 MCG/DAY IUD 1 each by Intrauterine route once.     metroNIDAZOLE (METROCREAM) 0.75 % cream Apply 1 Application topically  to affected skin 2 (two) times daily for 9 weeks. 45 g 1   Misc Natural Products (FIBER 7 PO) Take 1 tablet by mouth daily.     Multiple Vitamin (MULTIVITAMIN) capsule Take 1 capsule by mouth daily.     Probiotic Product (PROBIOTIC DAILY PO) Take 2 tablets by mouth 2 (two) times a week.     No current facility-administered medications for this visit.    Allergies as of 05/05/2023 - Review Complete 03/11/2023  Allergen Reaction Noted   Pineapple  Anaphylaxis 04/18/2013   Augmentin [amoxicillin-pot clavulanate] Nausea And Vomiting and Other (See Comments) 05/11/2013   Flonase [fluticasone propionate]  05/11/2017   Gluten meal Other (See Comments) 04/18/2013    Family History  Problem Relation Age of Onset   Seizures Mother    Heart disease Father    Hypertension Father    ADD / ADHD Father    Early death Father    Diabetes Maternal Grandmother    Thyroid disease Maternal Grandmother    Arthritis Maternal Grandmother    Hypothyroidism Maternal Grandmother    Cancer Paternal Grandfather    Heart disease Paternal Grandfather    Heart disease Paternal Uncle     Social History   Socioeconomic History   Marital status: Divorced    Spouse name: Not on file   Number of children: Not on file   Years of education: Not on file   Highest education level: Associate degree: academic program  Occupational History   Not on file  Tobacco Use   Smoking status: Never   Smokeless tobacco: Never  Substance and Sexual Activity   Alcohol use: No   Drug use: No   Sexual activity: Not Currently    Birth control/protection: I.U.D.  Other Topics Concern   Not on file  Social History Narrative   Not on file   Social Drivers of Health   Financial Resource Strain: Low Risk  (01/19/2023)   Overall Financial Resource Strain (CARDIA)    Difficulty of Paying Living Expenses: Not hard at all  Food Insecurity: No Food Insecurity (01/19/2023)   Hunger Vital Sign    Worried About Running Out of Food in the Last Year: Never true    Ran Out of Food in the Last Year: Never true  Transportation Needs: No Transportation Needs (01/19/2023)   PRAPARE - Administrator, Civil Service (Medical): No    Lack of Transportation (Non-Medical): No  Physical Activity: Sufficiently Active (01/19/2023)   Exercise Vital Sign    Days of Exercise per Week: 4 days    Minutes of Exercise per Session: 150+ min  Stress: Stress Concern Present  (01/19/2023)   [employer] of Occupational Health - Occupational Stress Questionnaire    Feeling of Stress : To some extent  Social Connections: Unknown (01/19/2023)   Social Connection and Isolation Panel [NHANES]    Frequency of Communication with Friends and Family: More than three times a week    Frequency of Social Gatherings with Friends and Family: More than three times a week    Attends Religious Services: Never    Database administrator or Organizations: Patient declined    Attends Banker Meetings: Not on file    Marital Status: Divorced  Intimate Partner Violence: Not At Risk (11/18/2021)   Humiliation, Afraid, Rape, and Kick questionnaire    Fear of Current or Ex-Partner: No    Emotionally Abused: No    Physically Abused: No  Sexually Abused: No    Review of Systems:    Constitutional: No weight loss, fever, chills, weakness or fatigue HEENT: Eyes: No change in vision               Ears, Nose, Throat:  No change in hearing or congestion Skin: No rash or itching Cardiovascular: No chest pain, chest pressure or palpitations   Respiratory: No SOB or cough Gastrointestinal: See HPI and otherwise negative Genitourinary: No dysuria or change in urinary frequency Neurological: No headache, dizziness or syncope Musculoskeletal: No new muscle or joint pain Hematologic: No bleeding or bruising Psychiatric: No history of depression or anxiety    Physical Exam:  Vital signs: BP 118/82   Pulse 87   Ht 5' 7.5" (1.715 m)   Wt 182 lb 8 oz (82.8 kg)   BMI 28.16 kg/m   Constitutional: NAD, Well developed, Well nourished, alert and cooperative Head:  Normocephalic and atraumatic. Eyes:   PEERL, EOMI. No icterus. Conjunctiva pink. Respiratory: Respirations even and unlabored. Lungs clear to auscultation bilaterally.   No wheezes, crackles, or rhonchi.  Cardiovascular:  Regular rate and rhythm. No peripheral edema, cyanosis or pallor.  Gastrointestinal:   Soft, nondistended, nontender. No rebound or guarding. Normal bowel sounds. No appreciable masses or hepatomegaly. Rectal:  Not performed.  Msk:  Symmetrical without gross deformities. Without edema, no deformity or joint abnormality.  Neurologic:  Alert and  oriented x4;  grossly normal neurologically.  Skin:   Dry and intact without significant lesions or rashes. Psychiatric: Oriented to person, place and time. Demonstrates good judgement and reason without abnormal affect or behaviors.   RELEVANT LABS AND IMAGING: CBC    Component Value Date/Time   WBC 7.9 01/19/2023 1432   RBC 4.45 01/19/2023 1432   HGB 13.2 01/19/2023 1432   HCT 41.2 01/19/2023 1432   PLT 346.0 01/19/2023 1432   MCV 92.6 01/19/2023 1432   MCH 29.1 11/03/2015 0552   MCHC 32.1 01/19/2023 1432   RDW 12.7 01/19/2023 1432   LYMPHSABS 2.5 01/19/2023 1432   MONOABS 0.5 01/19/2023 1432   EOSABS 0.1 01/19/2023 1432   BASOSABS 0.1 01/19/2023 1432    CMP     Component Value Date/Time   NA 139 01/19/2023 1432   K 4.2 01/19/2023 1432   CL 102 01/19/2023 1432   CO2 30 01/19/2023 1432   GLUCOSE 88 01/19/2023 1432   BUN 10 01/19/2023 1432   CREATININE 0.69 01/19/2023 1432   CALCIUM 10.1 01/19/2023 1432   PROT 7.3 03/23/2023 1307   ALBUMIN 4.6 03/23/2023 1307   AST 39 (H) 03/23/2023 1307   ALT 71 (H) 03/23/2023 1307   ALKPHOS 50 03/23/2023 1307   BILITOT 0.9 03/23/2023 1307     Assessment/Plan:      Elevated Liver Enzymes Recent elevation in liver enzymes with a pattern suggestive of fatty liver. No new medications or alcohol use. Family history of non-alcoholic cirrhosis.  A low titer positive ANA, likely nonspecific --CBC, CMP, PT/INR, alpha-1 antitrypsin, AMA, ASMA, ceruloplasmin --Schedule abdominal ultrasound to visualize liver, gallbladder, and bile ducts. --Advise patient to maintain a healthy diet, limit alcohol, salt, and fats.  Irritable Bowel Syndrome (IBS) Chronic IBS managed with fiber  supplement. -Advise patient to consider switching from fiber pills to a fiber powder for potentially better results.  General Health Maintenance -Remind patient to undergo colon cancer screening at age 52.     Body aches Weight gain Mother with early menopause.  Her sleep disturbances, body aches,  and weight gain despite adequate exercise and diet as well as her family history of a mother going to urgently menopause is suggestive of patient possibly experiencing early menopause resulting in the symptoms.  Would recommend following up with PCP/OB/GYN for further workup.  Assigned to Dr. Rhea Belton today  Boone Master, PA-C [facility] 05/05/2023, 10:57 AM  Cc: Tower, Audrie Gallus, MD

## 2023-05-05 NOTE — Patient Instructions (Addendum)
Your provider has requested that you go to the basement level for lab work before leaving today. Press "B" on the elevator. The lab is located at the first door on the left as you exit the elevator.  You have been scheduled for an abdominal ultrasound at [facility] (1st floor of hospital) on 05/11/23 at 8:00 am . Please arrive 15 minutes prior to your appointment for registration. Make certain not to have anything to eat or drink 6 hours prior to your appointment. Should you need to reschedule your appointment, please contact radiology at [phone]. This test typically takes about 30 minutes to perform.  _______________________________________________________  If your blood pressure at your visit was 140/90 or greater, please contact your primary care physician to follow up on this.  _______________________________________________________  If you are age 40 or older, your body mass index should be between 23-30. Your Body mass index is 28.16 kg/m. If this is out of the aforementioned range listed, please consider follow up with your Primary Care Provider.  If you are age 67 or younger, your body mass index should be between 19-25. Your Body mass index is 28.16 kg/m. If this is out of the aformentioned range listed, please consider follow up with your Primary Care Provider.   ________________________________________________________  The [facility] providers would like to encourage you to use [facility] to communicate with providers for non-urgent requests or questions.  Due to long hold times on the telephone, sending your provider a message by [facility] may be a faster and more efficient way to get a response.  Please allow 48 business hours for a response.  Please remember that this is for non-urgent requests.  _______________________________________________________  Due to recent changes in healthcare laws, you may see the results of your imaging and laboratory studies on MyChart before  your provider has had a chance to review them.  We understand that in some cases there may be results that are confusing or concerning to you. Not all laboratory results come back in the same time frame and the provider may be waiting for multiple results in order to interpret others.  Please give [place] 48 hours in order for your provider to thoroughly review all the results before contacting the office for clarification of your results.   Thank you for choosing me and [facility].  Boone Master, PA

## 2023-05-07 ENCOUNTER — Encounter: Payer: Self-pay | Admitting: Family Medicine

## 2023-05-08 LAB — CERULOPLASMIN: Ceruloplasmin: 22 mg/dL (ref 14–48)

## 2023-05-08 LAB — MITOCHONDRIAL ANTIBODIES: Mitochondrial M2 Ab, IgG: <=20 U (ref <=20.0)

## 2023-05-08 LAB — HEPATITIS B SURFACE ANTIBODY,QUALITATIVE: Hep B S Ab: REACTIVE — AB

## 2023-05-08 LAB — HEPATITIS B SURFACE ANTIGEN: Hepatitis B Surface Ag: NONREACTIVE

## 2023-05-08 LAB — HEPATITIS C ANTIBODY: Hepatitis C Ab: NONREACTIVE

## 2023-05-08 LAB — HEPATITIS A ANTIBODY, TOTAL: Hepatitis A AB,Total: REACTIVE — AB

## 2023-05-08 LAB — ANTI-SMOOTH MUSCLE ANTIBODY, IGG: Actin (Smooth Muscle) Antibody (IGG): <20 U (ref <20)

## 2023-05-08 LAB — HEPATITIS B CORE ANTIBODY, TOTAL: Hep B Core Total Ab: NONREACTIVE

## 2023-05-08 LAB — ALPHA-1-ANTITRYPSIN: A-1 Antitrypsin, Ser: 124 mg/dL (ref 83–199)

## 2023-05-11 ENCOUNTER — Ambulatory Visit (HOSPITAL_COMMUNITY): Payer: 59

## 2023-05-12 ENCOUNTER — Ambulatory Visit (HOSPITAL_COMMUNITY): Admission: RE | Admit: 2023-05-12 | Payer: 59 | Source: Ambulatory Visit

## 2023-05-18 DIAGNOSIS — F329 Major depressive disorder, single episode, unspecified: Secondary | ICD-10-CM | POA: Diagnosis not present

## 2023-05-18 DIAGNOSIS — Z713 Dietary counseling and surveillance: Secondary | ICD-10-CM | POA: Diagnosis not present

## 2023-05-18 DIAGNOSIS — F4323 Adjustment disorder with mixed anxiety and depressed mood: Secondary | ICD-10-CM | POA: Diagnosis not present

## 2023-05-18 DIAGNOSIS — Z724 Inappropriate diet and eating habits: Secondary | ICD-10-CM | POA: Diagnosis not present

## 2023-06-14 ENCOUNTER — Other Ambulatory Visit: Payer: Self-pay | Admitting: Family Medicine

## 2023-06-14 ENCOUNTER — Other Ambulatory Visit (HOSPITAL_BASED_OUTPATIENT_CLINIC_OR_DEPARTMENT_OTHER): Payer: Self-pay

## 2023-06-14 MED ORDER — BUPROPION HCL ER (XL) 300 MG PO TB24
300.0000 mg | ORAL_TABLET | Freq: Every day | ORAL | 0 refills | Status: DC
  Filled 2023-06-14 – 2023-08-03 (×2): qty 90, 90d supply, fill #0

## 2023-06-25 ENCOUNTER — Other Ambulatory Visit (HOSPITAL_BASED_OUTPATIENT_CLINIC_OR_DEPARTMENT_OTHER): Payer: Self-pay

## 2023-08-03 ENCOUNTER — Other Ambulatory Visit (HOSPITAL_COMMUNITY): Payer: Self-pay

## 2023-11-20 ENCOUNTER — Telehealth: Payer: Self-pay | Admitting: Family Medicine

## 2023-11-20 DIAGNOSIS — R7401 Elevation of levels of liver transaminase levels: Secondary | ICD-10-CM

## 2023-11-20 NOTE — Telephone Encounter (Signed)
 I recieed the following reminder  Claudene Naomie SAILOR, RN  Akiyah Eppolito, Laine LABOR, MD; Merlynn Lyle CROME, LCSW Pt needs repeat LFTs around 11/09/23; [place] pt; see patient message dated 05/12/23   Orders are in  Please call pt to schedule lab visit  Thanks

## 2023-11-21 NOTE — Telephone Encounter (Signed)
 Pt will do her labs tomorrow

## 2023-11-22 ENCOUNTER — Other Ambulatory Visit (HOSPITAL_COMMUNITY): Payer: Self-pay

## 2023-11-22 ENCOUNTER — Ambulatory Visit (INDEPENDENT_AMBULATORY_CARE_PROVIDER_SITE_OTHER): Admitting: Family Medicine

## 2023-11-22 ENCOUNTER — Encounter: Payer: Self-pay | Admitting: Family Medicine

## 2023-11-22 VITALS — BP 126/71 | HR 102 | Temp 98.6°F | Ht 67.25 in | Wt 181.0 lb

## 2023-11-22 DIAGNOSIS — F4323 Adjustment disorder with mixed anxiety and depressed mood: Secondary | ICD-10-CM | POA: Diagnosis not present

## 2023-11-22 DIAGNOSIS — Z0000 Encounter for general adult medical examination without abnormal findings: Secondary | ICD-10-CM | POA: Diagnosis not present

## 2023-11-22 DIAGNOSIS — R7401 Elevation of levels of liver transaminase levels: Secondary | ICD-10-CM

## 2023-11-22 DIAGNOSIS — J4520 Mild intermittent asthma, uncomplicated: Secondary | ICD-10-CM | POA: Insufficient documentation

## 2023-11-22 DIAGNOSIS — L918 Other hypertrophic disorders of the skin: Secondary | ICD-10-CM | POA: Diagnosis not present

## 2023-11-22 MED ORDER — ADVAIR DISKUS 250-50 MCG/ACT IN AEPB
1.0000 | INHALATION_SPRAY | Freq: Two times a day (BID) | RESPIRATORY_TRACT | 3 refills | Status: DC | PRN
  Filled 2023-11-22 – 2024-04-02 (×2): qty 60, 30d supply, fill #0

## 2023-11-22 MED ORDER — BUSPIRONE HCL 15 MG PO TABS
7.5000 mg | ORAL_TABLET | Freq: Two times a day (BID) | ORAL | 3 refills | Status: DC
  Filled 2023-11-22: qty 30, 30d supply, fill #0
  Filled 2023-12-18 – 2024-01-02 (×2): qty 30, 30d supply, fill #1

## 2023-11-22 NOTE — Addendum Note (Signed)
 Addended by: RAYLEEN GLENDORA HERO on: 11/22/2023 04:11 PM   Modules accepted: Orders

## 2023-11-22 NOTE — Patient Instructions (Addendum)
 Follow up with gyn  Get a mammogram with them  Call [facility] to set that up    Continue wellbutrin   Try adding buspar  7.5 mg twice daily (better on a schedule but can use as needed as well)  If side effects or you feel worse instead of better-stop it and let us  know   Follow up in 2 months (earlier if needed)   Labs today

## 2023-11-22 NOTE — Assessment & Plan Note (Signed)
 Very small  Left neck Used home kit -not totally gone  Will follow up for treatment if needed

## 2023-11-22 NOTE — Assessment & Plan Note (Signed)
Cmet today 

## 2023-11-22 NOTE — Assessment & Plan Note (Signed)
 Pt takes advair  250-50 mg prn when she has respiratory illness Only needs it then  Refilled today

## 2023-11-22 NOTE — Assessment & Plan Note (Addendum)
 Reviewed health habits including diet and exercise and skin cancer prevention Reviewed appropriate screening tests for age  Also reviewed health mt list, fam hx and immunization status , as well as social and family history   See HPI Labs reviewed and ordered Health Maintenance  Topic Date Due   Pneumococcal Vaccine (1 of 2 - PCV) Never done   Hepatitis B Vaccine (1 of 3 - 19+ 3-dose series) Never done   HPV Vaccine (1 - Risk 3-dose SCDM series) Never done   Pap with HPV screening  04/18/2016   Mammogram  08/06/2022   COVID-19 Vaccine (1) 02/04/2024*   Flu Shot  07/03/2024*   DTaP/Tdap/Td vaccine (3 - Td or Tdap) 07/18/2032   Hepatitis C Screening  Completed   HIV Screening  Completed   Meningitis B Vaccine  Aged Out  *Topic was postponed. The date shown is not the original due date.    Plans to get first annual mammogram at gyn office and will schedule herself  Pap utd-sent for last report from gyn Discussed fall prevention, supplements and exercise for bone density  PHQ and GAD7 scores up- addressed this  Encouraged more self care when time allows/ esp exercise

## 2023-11-22 NOTE — Progress Notes (Signed)
 Subjective:    Patient ID: Brittany Copeland, female    DOB: 1985-09-03, 38 y.o.   MRN: [id number]  HPI  Here for health maintenance exam and to review chronic medical problems   Wt Readings from Last 3 Encounters:  11/22/23 181 lb (82.1 kg)  05/05/23 182 lb 8 oz (82.8 kg)  01/19/23 179 lb 2 oz (81.3 kg)   28.14 kg/m  Vitals:   11/22/23 1526 11/22/23 1556  BP: (!) 142/78 126/71  Pulse: (!) 102   Temp: 98.6 F (37 C)   SpO2: 98%     Immunization History  Administered Date(s) Administered   Influenza, Seasonal, Injecte, Preservative Fre 01/19/2023   Td 04/06/1995   Tdap 07/19/2022    Health Maintenance Due  Topic Date Due   Pneumococcal Vaccine (1 of 2 - PCV) Never done   Hepatitis B Vaccines 19-59 Average Risk (1 of 3 - 19+ 3-dose series) Never done   HPV VACCINES (1 - Risk 3-dose SCDM series) Never done   Cervical Cancer Screening (HPV/Pap Cotest)  04/18/2016   MAMMOGRAM  08/06/2022     Mammogram  2023  Self breast exam- no lumps /nothing new   Gyn health [facility]  Due for appointment pretty soon  Last pap was last fall -normal HPV in the past     Colon cancer screening -no family history    Bone health  Falls- none  Fractures-none  Supplements - vitamin D  /not always compliant  Last vitamin D  Lab Results  Component Value Date   VD25OH 17.91 (L) 01/19/2023    Exercise :  Has not had time to do  Lack of time for self care  Can start walking during lunch   Small skin tag left neck  Treated at home with freezing kit-did not totally get rid of it    Mood    11/22/2023    3:30 PM 01/19/2023    2:07 PM 09/24/2022   10:58 AM 04/21/2022    9:22 AM 04/12/2022    1:29 PM  Depression screen PHQ 2/9  Decreased Interest 1 1 0 0   Down, Depressed, Hopeless 0 0 0 0   PHQ - 2 Score 1 1 0 0   Altered sleeping 3 3 2     Tired, decreased energy 2 2 1     Change in appetite 1 0 2    Feeling bad or failure about  yourself  0 0 0    Trouble concentrating 1 2 0    Moving slowly or fidgety/restless 2 1 0    Suicidal thoughts 0 0 0    PHQ-9 Score 10 9 5     Difficult doing work/chores Somewhat difficult Very difficult Somewhat difficult       Information is confidential and restricted. Go to Review Flowsheets to unlock data.      11/22/2023    3:31 PM 09/24/2022   10:58 AM 04/12/2022    1:27 PM 11/18/2021   10:16 AM  GAD 7 : Generalized Anxiety Score  Nervous, Anxious, on Edge 3 2    Control/stop worrying 1 1    Worry too much - different things 1 0    Trouble relaxing 2 2    Restless 2 0    Easily annoyed or irritable 0 0    Afraid - awful might happen 1 0    Total GAD 7 Score 10 5    Anxiety Difficulty Somewhat difficult Somewhat difficult  Information is confidential and restricted. Go to Review Flowsheets to unlock data.     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  Daughter is starting in new school     Patient Active Problem List   Diagnosis Date Noted   Asthma, mild intermittent 11/22/2023   Skin tag 11/22/2023   Elevated antinuclear antibody (ANA) level 03/29/2023   Elevated transaminase level 01/23/2023   Nocturnal leg cramps 01/19/2023   Joint pain 01/19/2023   Weight gain 01/19/2023   Muscle pain 01/19/2023   Family history of early menopause 01/19/2023   Headache, chronic daily 01/19/2023   Grief reaction 01/19/2023   Defecation urgency 04/21/2022   Adjustment disorder with mixed anxiety and depressed mood 11/18/2021   Reactive depression 09/16/2021   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   Right knee pain 09/11/2013   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   Abnormal cervical Pap smear with positive HPV  DNA test 04/25/2013   Celiac disease 04/19/2013   Routine general medical examination at a health care facility 04/18/2013   Encounter for routine gynecological examination 04/18/2013   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   Migraine headache 07/20/2006   Past Medical History:  Diagnosis Date   Allergy    Anemia    Anxiety    Arthritis    Asthma    Celiac disease    Chronic headaches    Depression    GERD (gastroesophageal reflux disease)    Headache    Inflammatory bowel disease    Vaginal Pap smear, abnormal    Past Surgical History:  Procedure Laterality Date   ADENOIDECTOMY     Social History   Tobacco Use   Smoking status: Never   Smokeless tobacco: Never  Substance Use Topics   Alcohol use: No   Drug use: No   Family History  Problem Relation Age of Onset   Seizures Mother    Heart disease Father    Hypertension Father    ADD / ADHD Father    Early death Father    Diabetes Maternal Grandmother    Thyroid  disease Maternal Grandmother    Arthritis Maternal Grandmother    Hypothyroidism Maternal Grandmother    Cancer Paternal Grandfather    Heart disease Paternal Grandfather    Heart disease Paternal Uncle    Allergies  Allergen Reactions   Pineapple Anaphylaxis   Augmentin  [Amoxicillin -Pot Clavulanate] Nausea And Vomiting and Other (See Comments)    Has patient had a PCN reaction causing immediate rash, facial/tongue/throat swelling, SOB or lightheadedness with hypotension: No Has patient had a PCN reaction causing severe rash involving mucus membranes or skin necrosis: No Has patient had a PCN reaction that required hospitalization No Has patient had a PCN reaction occurring within the last 10 years: Yes If all of the above answers are NO, then may proceed with Cephalosporin use.    Flonase  [Fluticasone  Propionate]     Made pt feel flushed and face turned red   Gluten Meal Other (See Comments)    Pt has celiac disease.     Current Outpatient  Medications on File Prior to Visit  Medication Sig Dispense Refill   b complex vitamins capsule Take 1 capsule by  mouth daily.     Biotin w/ Vitamins C & E (HAIR/SKIN/NAILS PO) Take by mouth.     buPROPion  (WELLBUTRIN  XL) 300 MG 24 hr tablet Take 1 tablet (300 mg total) by mouth daily. 90 tablet 0   levonorgestrel  (MIRENA , 52 MG,) 20 MCG/DAY IUD 1 each by Intrauterine route once.     metroNIDAZOLE  (METROCREAM ) 0.75 % cream Apply 1 Application topically to affected skin 2 (two) times daily for 9 weeks. 45 g 1   Misc Natural Products (FIBER 7 PO) Take 1 tablet by mouth daily.     Multiple Vitamin (MULTIVITAMIN) capsule Take 1 capsule by mouth daily.     Probiotic Product (PROBIOTIC DAILY PO) Take 2 tablets by mouth 2 (two) times a week.     No current facility-administered medications on file prior to visit.    Review of Systems  Constitutional:  Negative for activity change, appetite change, fatigue, fever and unexpected weight change.  HENT:  Negative for congestion, ear pain, rhinorrhea, sinus pressure and sore throat.   Eyes:  Negative for pain, redness and visual disturbance.  Respiratory:  Negative for cough, shortness of breath and wheezing.   Cardiovascular:  Negative for chest pain and palpitations.  Gastrointestinal:  Negative for abdominal pain, blood in stool, constipation and diarrhea.  Endocrine: Negative for polydipsia and polyuria.  Genitourinary:  Negative for dysuria, frequency and urgency.  Musculoskeletal:  Negative for arthralgias, back pain and myalgias.  Skin:  Negative for pallor and rash.       Skin tag  Allergic/Immunologic: Negative for environmental allergies.  Neurological:  Negative for dizziness, syncope and headaches.  Hematological:  Negative for adenopathy. Does not bruise/bleed easily.  Psychiatric/Behavioral:  Positive for sleep disturbance. Negative for decreased concentration and dysphoric mood. The patient is nervous/anxious.        Objective:    Physical Exam Constitutional:      General: She is not in acute distress.    Appearance: Normal appearance. She is well-developed and normal weight. She is not ill-appearing or diaphoretic.  HENT:     Head: Normocephalic and atraumatic.     Right Ear: Tympanic membrane, ear canal and external ear normal.     Left Ear: Tympanic membrane, ear canal and external ear normal.     Nose: Nose normal. No congestion.     Mouth/Throat:     Mouth: Mucous membranes are moist.     Pharynx: Oropharynx is clear. No posterior oropharyngeal erythema.  Eyes:     General: No scleral icterus.    Extraocular Movements: Extraocular movements intact.     Conjunctiva/sclera: Conjunctivae normal.     Pupils: Pupils are equal, round, and reactive to light.  Neck:     Thyroid : No thyromegaly.     Vascular: No carotid bruit or JVD.  Cardiovascular:     Rate and Rhythm: Normal rate and regular rhythm.     Pulses: Normal pulses.     Heart sounds: Normal heart sounds.     No gallop.  Pulmonary:     Effort: Pulmonary effort is normal. No respiratory distress.     Breath sounds: Normal breath sounds. No wheezing.     Comments: Good air exch Chest:     Chest wall: No tenderness.  Abdominal:     General: Bowel sounds are normal. There is no distension or abdominal bruit.     Palpations: Abdomen is soft. There is no mass.     Tenderness: There is no abdominal tenderness.  Hernia: No hernia is present.  Genitourinary:    Comments: Breast and pelvic exam are done by gyn provider   Musculoskeletal:        General: No tenderness. Normal range of motion.     Cervical back: Normal range of motion and neck supple. No rigidity. No muscular tenderness.     Right lower leg: No edema.     Left lower leg: No edema.     Comments: No kyphosis   Lymphadenopathy:     Cervical: No cervical adenopathy.  Skin:    General: Skin is warm and dry.     Coloration: Skin is not pale.     Findings: No erythema or rash.      Comments: Solar lentigines diffusely  1-2 mm skin tag left neck  Neurological:     Mental Status: She is alert. Mental status is at baseline.     Cranial Nerves: No cranial nerve deficit.     Motor: No abnormal muscle tone.     Coordination: Coordination normal.     Gait: Gait normal.     Deep Tendon Reflexes: Reflexes are normal and symmetric. Reflexes normal.  Psychiatric:        Mood and Affect: Mood is anxious.        Cognition and Memory: Cognition and memory normal.     Comments: Candidly discusses symptoms and stressors             Assessment & Plan:   Problem List Items Addressed This Visit       Respiratory   Asthma, mild intermittent   Pt takes advair  250-50 mg prn when she has respiratory illness Only needs it then  Refilled today      Relevant Medications   ADVAIR  DISKUS 250-50 MCG/ACT AEPB     Musculoskeletal and Integument   Skin tag   Very small  Left neck Used home kit -not totally gone  Will follow up for treatment if needed         Other   Routine general medical examination at a health care facility - Primary   Reviewed health habits including diet and exercise and skin cancer prevention Reviewed appropriate screening tests for age  Also reviewed health mt list, fam hx and immunization status , as well as social and family history   See HPI Labs reviewed and ordered Health Maintenance  Topic Date Due   Pneumococcal Vaccine (1 of 2 - PCV) Never done   Hepatitis B Vaccine (1 of 3 - 19+ 3-dose series) Never done   HPV Vaccine (1 - Risk 3-dose SCDM series) Never done   Pap with HPV screening  04/18/2016   Mammogram  08/06/2022   COVID-19 Vaccine (1) 02/04/2024*   Flu Shot  07/03/2024*   DTaP/Tdap/Td vaccine (3 - Td or Tdap) 07/18/2032   Hepatitis C Screening  Completed   HIV Screening  Completed   Meningitis B Vaccine  Aged Out  *Topic was postponed. The date shown is not the original due date.    Plans to get first annual mammogram  at gyn office and will schedule herself  Pap utd-sent for last report from gyn Discussed fall prevention, supplements and exercise for bone density  PHQ and GAD7 scores up- addressed this  Encouraged more self care when time allows/ esp exercise        Relevant Orders   TSH   Lipid panel   Comprehensive metabolic panel with GFR   CBC with Differential/Platelet  VITAMIN D  25 Hydroxy (Vit-D Deficiency, Fractures)   Elevated transaminase level   Cmet today      Adjustment disorder with mixed anxiety and depressed mood   Worse lately with situational stressors  Reviewed stressors/ coping techniques/symptoms/ support sources/ tx options and side effects in detail today  Plan to try buspar  7.5 mg bid for anxiety symptoms Continue wellbutrin  xl 300 mg  Continue counseling if helpful  Follow up 2 mo or earlier if needed   Call back and Er precautions noted in detail today         Relevant Medications   busPIRone  (BUSPAR ) 15 MG tablet

## 2023-11-22 NOTE — Assessment & Plan Note (Signed)
 Worse lately with situational stressors  Reviewed stressors/ coping techniques/symptoms/ support sources/ tx options and side effects in detail today  Plan to try buspar  7.5 mg bid for anxiety symptoms Continue wellbutrin  xl 300 mg  Continue counseling if helpful  Follow up 2 mo or earlier if needed   Call back and Er precautions noted in detail today

## 2023-11-23 ENCOUNTER — Ambulatory Visit: Payer: Self-pay | Admitting: Family Medicine

## 2023-11-23 ENCOUNTER — Other Ambulatory Visit (HOSPITAL_COMMUNITY): Payer: Self-pay

## 2023-11-23 LAB — COMPREHENSIVE METABOLIC PANEL WITH GFR
ALT: 47 U/L — ABNORMAL HIGH (ref 0–35)
AST: 27 U/L (ref 0–37)
Albumin: 4.6 g/dL (ref 3.5–5.2)
Alkaline Phosphatase: 40 U/L (ref 39–117)
BUN: 14 mg/dL (ref 6–23)
CO2: 29 meq/L (ref 19–32)
Calcium: 9.5 mg/dL (ref 8.4–10.5)
Chloride: 101 meq/L (ref 96–112)
Creatinine, Ser: 0.69 mg/dL (ref 0.40–1.20)
GFR: 108.83 mL/min (ref >60.00)
Glucose, Bld: 126 mg/dL — ABNORMAL HIGH (ref 70–99)
Potassium: 3.8 meq/L (ref 3.5–5.1)
Sodium: 136 meq/L (ref 135–145)
Total Bilirubin: 0.6 mg/dL (ref 0.2–1.2)
Total Protein: 7.5 g/dL (ref 6.0–8.3)

## 2023-11-23 LAB — LIPID PANEL
Cholesterol: 186 mg/dL (ref 0–200)
HDL: 70.3 mg/dL (ref >39.00)
LDL Cholesterol: 94 mg/dL (ref 0–99)
NonHDL: 115.8
Total CHOL/HDL Ratio: 3
Triglycerides: 111 mg/dL (ref 0.0–149.0)
VLDL: 22.2 mg/dL (ref 0.0–40.0)

## 2023-11-23 LAB — CBC WITH DIFFERENTIAL/PLATELET
Basophils Absolute: 0 K/uL (ref 0.0–0.1)
Basophils Relative: 0.4 % (ref 0.0–3.0)
Eosinophils Absolute: 0 K/uL (ref 0.0–0.7)
Eosinophils Relative: 0.2 % (ref 0.0–5.0)
HCT: 38.7 % (ref 36.0–46.0)
Hemoglobin: 13.1 g/dL (ref 12.0–15.0)
Lymphocytes Relative: 31.7 % (ref 12.0–46.0)
Lymphs Abs: 3.2 K/uL (ref 0.7–4.0)
MCHC: 33.7 g/dL (ref 30.0–36.0)
MCV: 88.4 fl (ref 78.0–100.0)
Monocytes Absolute: 0.4 K/uL (ref 0.1–1.0)
Monocytes Relative: 3.7 % (ref 3.0–12.0)
Neutro Abs: 6.4 K/uL (ref 1.4–7.7)
Neutrophils Relative %: 64 % (ref 43.0–77.0)
Platelets: 368 K/uL (ref 150.0–400.0)
RBC: 4.38 Mil/uL (ref 3.87–5.11)
RDW: 12.3 % (ref 11.5–15.5)
WBC: 10 K/uL (ref 4.0–10.5)

## 2023-11-23 LAB — VITAMIN D 25 HYDROXY (VIT D DEFICIENCY, FRACTURES): VITD: 25.9 ng/mL — ABNORMAL LOW (ref 30.00–100.00)

## 2023-11-23 LAB — TSH: TSH: 0.83 u[IU]/mL (ref 0.35–5.50)

## 2023-12-18 ENCOUNTER — Other Ambulatory Visit: Payer: Self-pay | Admitting: Family Medicine

## 2023-12-18 DIAGNOSIS — F4323 Adjustment disorder with mixed anxiety and depressed mood: Secondary | ICD-10-CM

## 2023-12-19 ENCOUNTER — Other Ambulatory Visit (HOSPITAL_COMMUNITY): Payer: Self-pay

## 2023-12-19 ENCOUNTER — Other Ambulatory Visit: Payer: Self-pay

## 2023-12-19 IMAGING — MG DIGITAL DIAGNOSTIC BILAT W/ TOMO W/ CAD
6 of 10 series · 6 of 30 positions shown · non-contrast
Comparison: Previous exam(s).

CLINICAL DATA: 35-year-old female presenting for evaluation of left
nipple changes. Patient reports yellow and blue bumps on the left
nipple for several months. No nipple discharge. No strong family
history of breast cancer.

EXAM:
DIGITAL DIAGNOSTIC BILATERAL MAMMOGRAM WITH TOMOSYNTHESIS AND CAD;
ULTRASOUND LEFT BREAST LIMITED
TECHNIQUE: Bilateral digital diagnostic mammography and breast tomosynthesis
was performed. The images were evaluated with computer-aided
detection.; Targeted ultrasound examination of the left breast was
performed.

[L TAN synth-2D]
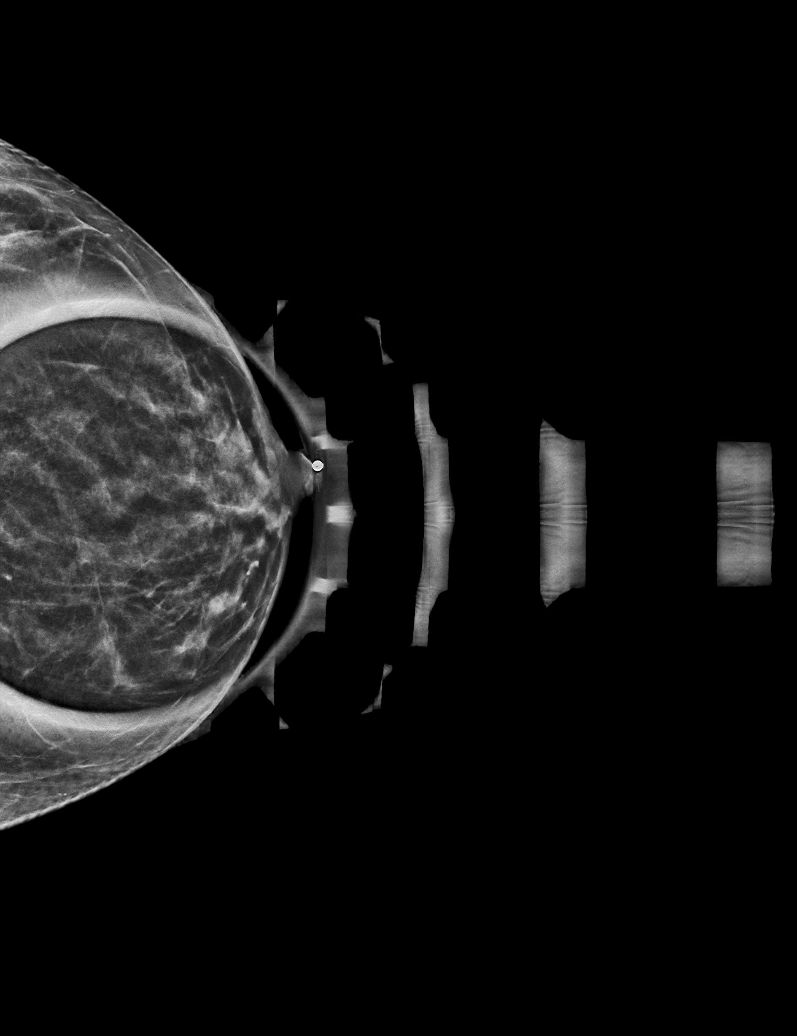

[L CC synth-2D]
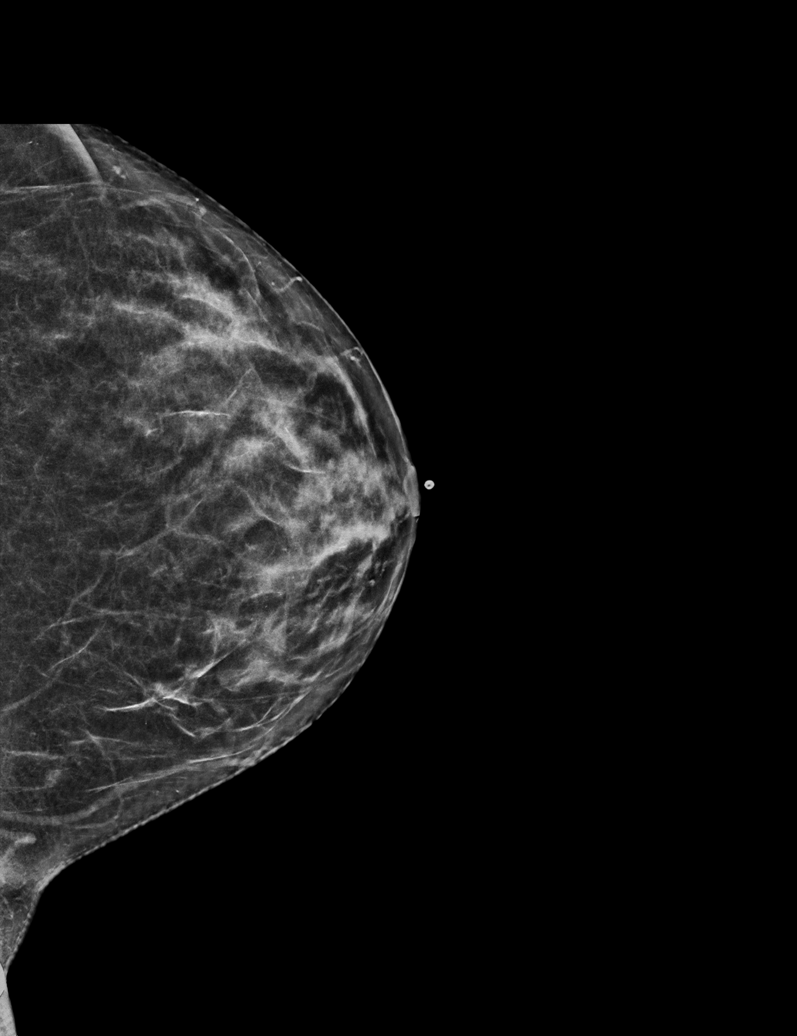

[R MLO synth-2D]
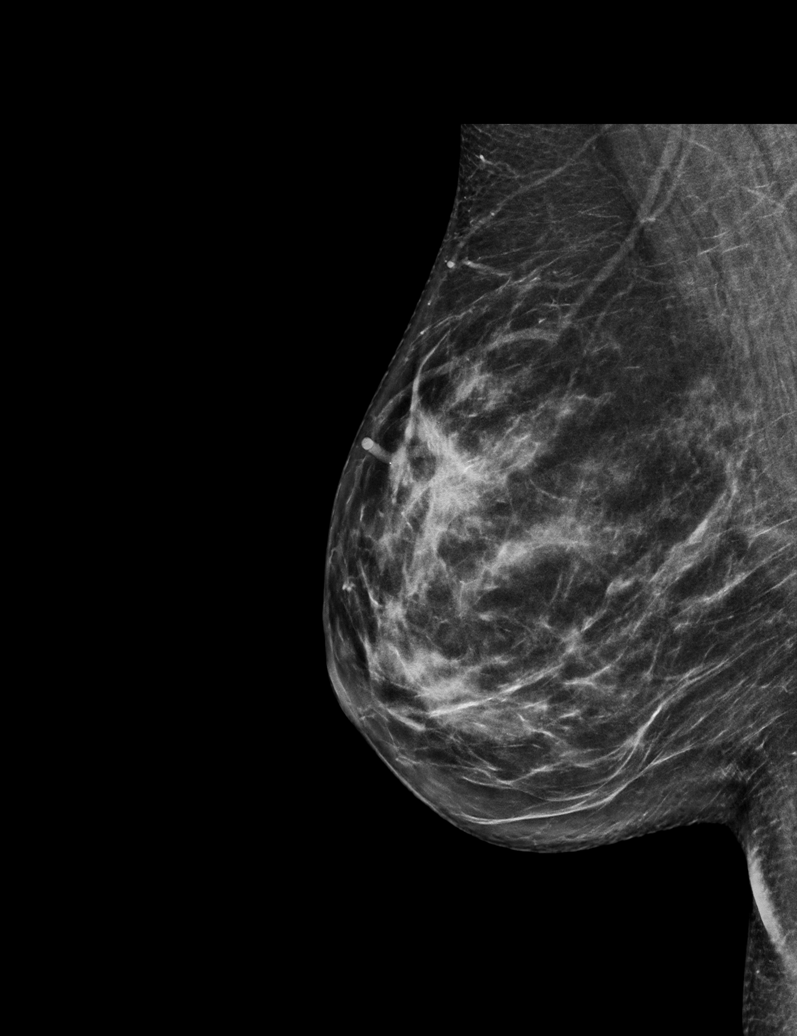

[L MLO synth-2D]
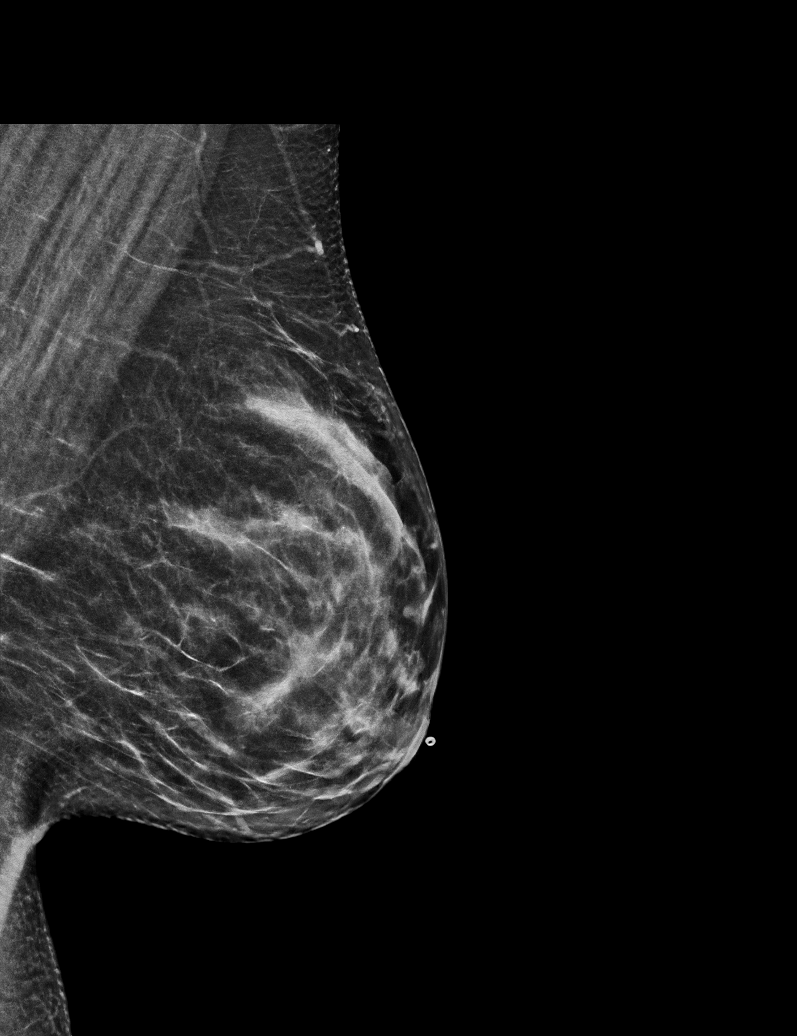

[R CC synth-2D]
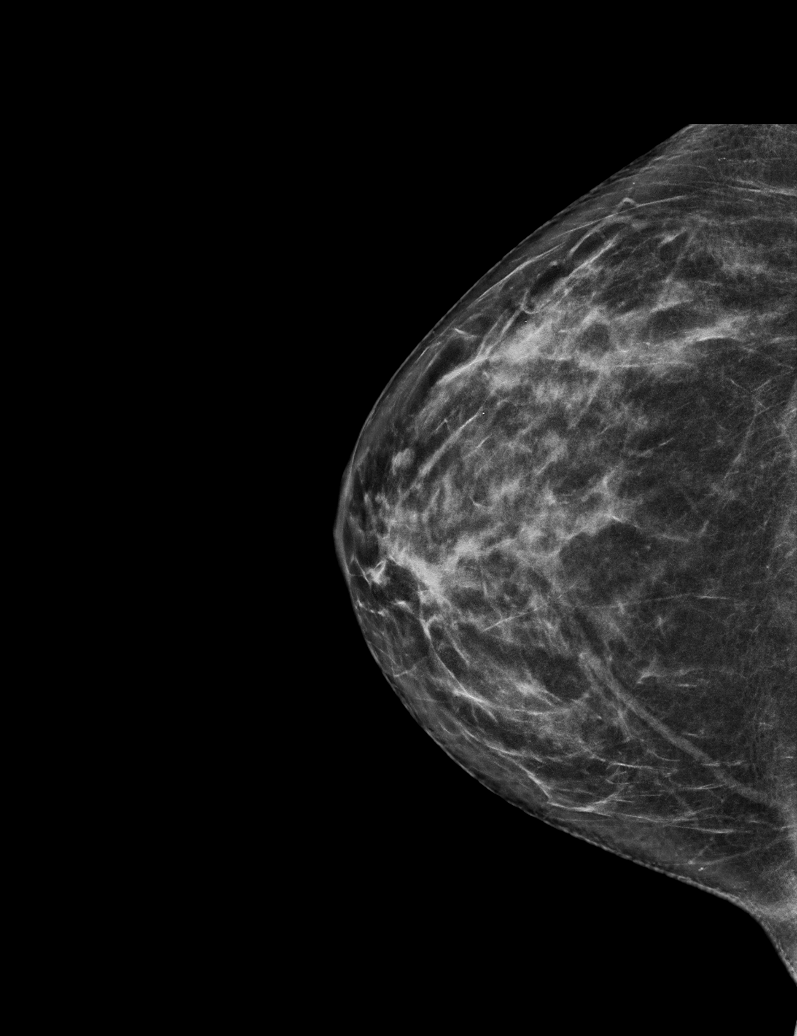

[R CC tomo · tomo slice 31/61.0]
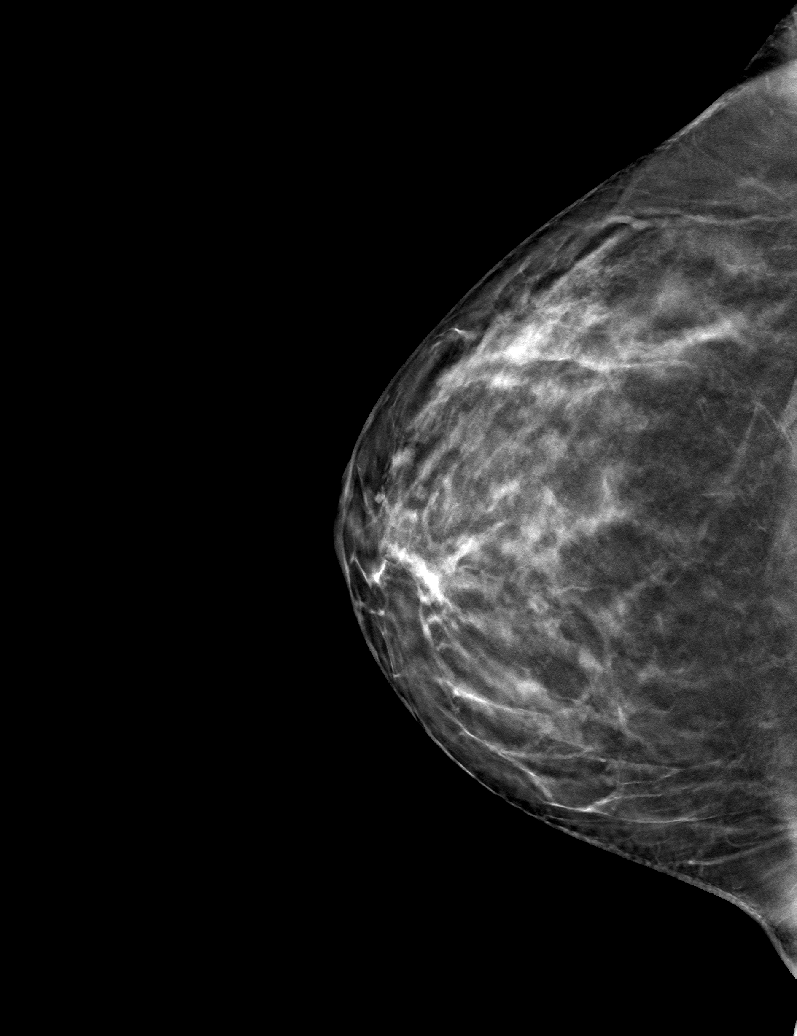

[6 of 30 positions shown; findings below may reference images not displayed]

ACR Breast Density Category c: The breast tissue is heterogeneously
dense, which may obscure small masses.
FINDINGS: Mammogram:

Right breast: No suspicious mass, distortion, or microcalcifications
are identified to suggest presence of malignancy.

Left breast: A skin BB marks the site of concern at the left nipple.
A retroareolar spot compression view was performed in addition to
standard views demonstrating no abnormality in the retroareolar
region or elsewhere in the left breast to suggest the presence of
malignancy.

On physical exam the patient has 2 tiny pustules on the left nipple
as well as a tiny blue colored lesion on the nipple. There is mild
erythema of the nipple.

Ultrasound:

Targeted ultrasound of the left nipple is performed demonstrating a
few tiny cystic masses within the nipple. One of which is mildly
heterogeneous. No internal vascularity. There is no suspicious mass
or dilated duct in the retroareolar region.
IMPRESSION: 1. Indeterminate tiny lesions and erythema involving the left
nipple. Paget's disease is felt to be less likely. No mammographic
or sonographic evidence of malignancy within the left breast.

2.  No mammographic evidence of malignancy in the right breast.

RECOMMENDATION:
1.  Dermatology consultation for the left nipple lesions.

2.  Begin routine annual screening mammography at age 38.

I have discussed the findings and recommendations with the patient.
If applicable, a reminder letter will be sent to the patient
regarding the next appointment.

BI-RADS CATEGORY  2: Benign.

## 2023-12-19 MED ORDER — BUPROPION HCL ER (XL) 300 MG PO TB24
300.0000 mg | ORAL_TABLET | Freq: Every day | ORAL | 0 refills | Status: DC
  Filled 2023-12-19 – 2024-01-02 (×2): qty 90, 90d supply, fill #0

## 2023-12-29 ENCOUNTER — Other Ambulatory Visit (HOSPITAL_COMMUNITY): Payer: Self-pay

## 2024-01-02 ENCOUNTER — Other Ambulatory Visit (HOSPITAL_COMMUNITY): Payer: Self-pay

## 2024-01-12 DIAGNOSIS — Z713 Dietary counseling and surveillance: Secondary | ICD-10-CM | POA: Diagnosis not present

## 2024-01-12 DIAGNOSIS — E663 Overweight: Secondary | ICD-10-CM | POA: Diagnosis not present

## 2024-01-16 ENCOUNTER — Other Ambulatory Visit (HOSPITAL_COMMUNITY): Payer: Self-pay

## 2024-01-16 MED ORDER — FLUZONE 0.5 ML IM SUSY
0.5000 mL | PREFILLED_SYRINGE | Freq: Once | INTRAMUSCULAR | 0 refills | Status: AC
  Filled 2024-01-16: qty 0.5, 1d supply, fill #0

## 2024-02-06 ENCOUNTER — Ambulatory Visit: Admitting: Family Medicine

## 2024-02-08 ENCOUNTER — Other Ambulatory Visit (HOSPITAL_COMMUNITY): Payer: Self-pay

## 2024-02-08 ENCOUNTER — Ambulatory Visit: Admitting: Family Medicine

## 2024-02-08 ENCOUNTER — Encounter: Payer: Self-pay | Admitting: Family Medicine

## 2024-02-08 ENCOUNTER — Telehealth (INDEPENDENT_AMBULATORY_CARE_PROVIDER_SITE_OTHER): Admitting: Family Medicine

## 2024-02-08 DIAGNOSIS — F4323 Adjustment disorder with mixed anxiety and depressed mood: Secondary | ICD-10-CM | POA: Diagnosis not present

## 2024-02-08 MED ORDER — BUSPIRONE HCL 15 MG PO TABS
15.0000 mg | ORAL_TABLET | Freq: Two times a day (BID) | ORAL | 1 refills | Status: AC
  Filled 2024-02-08: qty 135, 68d supply, fill #0
  Filled 2024-02-08: qty 46, 23d supply, fill #0
  Filled 2024-02-08: qty 134, 67d supply, fill #0
  Filled 2024-02-27: qty 44, 22d supply, fill #0
  Filled 2024-02-27: qty 136, 68d supply, fill #0
  Filled 2024-03-25: qty 180, 90d supply, fill #0

## 2024-02-08 NOTE — Progress Notes (Signed)
 Virtual Visit via Video Note  I connected with Brittany Copeland on 02/08/24 at  2:30 PM EST by a video enabled telemedicine application and verified that I am speaking with the correct person using two identifiers.  Patient Location: Home Provider Location: Office/Clinic  I discussed the limitations, risks, security, and privacy concerns of performing an evaluation and management service by video and the availability of in person appointments. I also discussed with the patient that there may be a patient responsible charge related to this service. The patient expressed understanding and agreed to proceed.  Parties involved in encounter  Patient: Brittany Copeland  Provider:  Laine Balls MD   Subjective: PCP: Balls Laine LABOR, MD  Chief Complaint  Patient presents with   Medical Management of Chronic Issues    Stress and mood   HPI Pt presents for  Follow up of mood     History of adjustment reaction with anx/dep mood   Last visit:   Worse lately with situational stressors  Reviewed stressors/ coping techniques/symptoms/ support sources/ tx options and side effects in detail today   Plan to try buspar  7.5 mg bid for anxiety symptoms Continue wellbutrin  xl 300 mg  Continue counseling if helpful  Follow up 2 mo or earlier if needed     Thinks she is doing some better  Feels more calm (does not always remember to take 2nd buspar  dose in evening)  Does not sleep well regardless   Is open to increase dose of buspar    Does not really feel down     Has more saliva in mouth  Stress is still high   Counseling  Still in therapy  Hard to tell if it is helping   Does not have school now    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 (thinks environment will be better)  Will still work prn with Dr Waddell in clinic    Not a lot of time for self care Trying her best   Started seeing a nutritionist for weight/ nutrition-not eating enough       ROS:  Per HPI  Current Outpatient Medications:    ADVAIR  DISKUS 250-50 MCG/ACT AEPB, Inhale 1 puff into the lungs 2 (two) times daily as needed., Disp: 60 each, Rfl: 3   b complex vitamins capsule, Take 1 capsule by mouth daily., Disp: , Rfl:    Biotin w/ Vitamins C & E (HAIR/SKIN/NAILS PO), Take by mouth., Disp: , Rfl:    buPROPion  (WELLBUTRIN  XL) 300 MG 24 hr tablet, Take 1 tablet (300 mg total) by mouth daily., Disp: 90 tablet, Rfl: 0   Cholecalciferol (VITAMIN D3) 50 MCG (2000 UT) CAPS, Take 1 capsule by mouth 3 (three) times a week., Disp: , Rfl:    levonorgestrel  (MIRENA , 52 MG,) 20 MCG/DAY IUD, 1 each by Intrauterine route once., Disp: , Rfl:    Misc Natural Products (FIBER 7 PO), Take 1 tablet by mouth daily., Disp: , Rfl:    Multiple Vitamin (MULTIVITAMIN) capsule, Take 1 capsule by mouth daily., Disp: , Rfl:    Probiotic Product (PROBIOTIC DAILY PO), Take 2 tablets by mouth 2 (two) times a week., Disp: , Rfl:    busPIRone  (BUSPAR ) 15 MG tablet, Take 1 tablet (15 mg total) by mouth 2 (two) times daily., Disp: 180 tablet, Rfl: 1  Observations/Objective: Today's Vitals   02/08/24 1426  Pulse: 98  SpO2: 98%  Weight: 183 lb (83 kg)   Review of Systems  Constitutional:  Negative for chills, fever and malaise/fatigue.  HENT:  Negative for congestion, ear pain, sinus pain and sore throat.   Eyes:  Negative for blurred vision, discharge and redness.  Respiratory:  Negative for cough, shortness of breath and stridor.   Cardiovascular:  Negative for chest pain, palpitations and leg swelling.  Gastrointestinal:  Negative for abdominal pain, diarrhea, nausea and vomiting.  Musculoskeletal:  Negative for myalgias.  Skin:  Negative for rash.  Neurological:  Negative for dizziness and headaches.  Psychiatric/Behavioral:  Negative for depression, memory loss, substance abuse and suicidal ideas. The patient is nervous/anxious and has insomnia.     Physical Exam Patient appears well, in no  distress Weight is baseline  No facial swelling or asymmetry Normal voice-not hoarse and no slurred speech No obvious tremor or mobility impairment Moving neck and UEs normally Able to hear the call well  No cough or shortness of breath during interview  Talkative and mentally sharp with no cognitive changes No skin changes on face or neck , no rash or pallor Affect is normal , not tearful   Assessment and Plan: Adjustment disorder with mixed anxiety and depressed mood Assessment & Plan: Stress continues -though change in job position will likely be good and she is done with school Anxiety is improved with buspar  some  Doing well with wellbutrin   Working on self care with mental health therapist and nutritionist  Recommend increase buspar  to 15 mg bid (instructed to call if side effects)   Discussed expectations of this medication including time to effectiveness and mechanism of action, also poss of side effects (early and late)- including mental fuzziness, weight or appetite change, nausea and poss of worse dep or anxiety (even suicidal thoughts)  Pt voiced understanding and will stop med and update if this occurs   Do not expect problems as tolerates the 7.5 mg well   Update if no further improvement  Call back and Er precautions noted in detail today    Orders: -     busPIRone  HCl; Take 1 tablet (15 mg total) by mouth 2 (two) times daily.  Dispense: 180 tablet; Refill: 1    Follow Up Instructions: No follow-ups on file.  Go up on buspar  to 15 mg twice daily  Call if any concerns or side effects Continue wellbutrin  xl at current dose Continue counseling  Keep working on self care as you can    I discussed the assessment and treatment plan with the patient. The patient was provided an opportunity to ask questions, and all were answered. The patient agreed with the plan and demonstrated an understanding of the instructions.   The patient was advised to call back or seek an  in-person evaluation if the symptoms worsen or if the condition fails to improve as anticipated.  The above assessment and management plan was discussed with the patient. The patient verbalized understanding of and has agreed to the management plan.   Laine Balls, MD

## 2024-02-08 NOTE — Patient Instructions (Addendum)
 Go up on buspar  to 15 mg twice daily  Call if any concerns or side effects  (or if it makes you feel worse instead of better)  Continue wellbutrin  xl at current dose Continue counseling  Keep working on self care as you can

## 2024-02-08 NOTE — Assessment & Plan Note (Signed)
 Stress continues -though change in job position will likely be good and she is done with school Anxiety is improved with buspar  some  Doing well with wellbutrin   Working on self care with mental health therapist and nutritionist  Recommend increase buspar  to 15 mg bid (instructed to call if side effects)   Discussed expectations of this medication including time to effectiveness and mechanism of action, also poss of side effects (early and late)- including mental fuzziness, weight or appetite change, nausea and poss of worse dep or anxiety (even suicidal thoughts)  Pt voiced understanding and will stop med and update if this occurs   Do not expect problems as tolerates the 7.5 mg well   Update if no further improvement  Call back and Er precautions noted in detail today

## 2024-02-20 ENCOUNTER — Other Ambulatory Visit (HOSPITAL_COMMUNITY): Payer: Self-pay

## 2024-02-27 ENCOUNTER — Other Ambulatory Visit (HOSPITAL_COMMUNITY): Payer: Self-pay

## 2024-03-06 ENCOUNTER — Ambulatory Visit: Admitting: Dermatology

## 2024-03-06 ENCOUNTER — Encounter: Payer: Self-pay | Admitting: Dermatology

## 2024-03-06 DIAGNOSIS — L821 Other seborrheic keratosis: Secondary | ICD-10-CM | POA: Diagnosis not present

## 2024-03-06 DIAGNOSIS — D1801 Hemangioma of skin and subcutaneous tissue: Secondary | ICD-10-CM | POA: Diagnosis not present

## 2024-03-06 DIAGNOSIS — Z7189 Other specified counseling: Secondary | ICD-10-CM

## 2024-03-06 DIAGNOSIS — L820 Inflamed seborrheic keratosis: Secondary | ICD-10-CM | POA: Diagnosis not present

## 2024-03-06 DIAGNOSIS — I781 Nevus, non-neoplastic: Secondary | ICD-10-CM

## 2024-03-06 DIAGNOSIS — L719 Rosacea, unspecified: Secondary | ICD-10-CM | POA: Diagnosis not present

## 2024-03-06 DIAGNOSIS — Z79899 Other long term (current) drug therapy: Secondary | ICD-10-CM

## 2024-03-06 DIAGNOSIS — D229 Melanocytic nevi, unspecified: Secondary | ICD-10-CM

## 2024-03-06 NOTE — Patient Instructions (Addendum)
 Start  [facility] metronidazole /ivermectin/azelaic acid twice daily as needed to affected areas on the face. .    Instructions for [facility] Medications  One or more of your medications was sent to the [facility] mail order compounding pharmacy. You will receive an email from them and can purchase the medicine through that link. It will then be mailed to your home at the address you confirmed. If for any reason you do not receive an email from them, please check your spam folder. If you still do not find the email, please let us  know. [facility] phone number is [phone].  Counseling for BBL / IPL / Laser and Coordination of Care Discussed the treatment option of Broad Band Light (BBL) /Intense Pulsed Light (IPL)/ Laser for skin discoloration, including brown spots and redness.  Typically we recommend at least 1-3 treatment sessions about 5-8 weeks apart for best results.  Cannot have tanned skin when BBL performed, and regular use of sunscreen/photoprotection is advised after the procedure to help maintain results. The patient's condition may also require maintenance treatments in the future.  The fee for BBL / laser treatments is $350 per treatment session for the whole face.  A fee can be quoted for other parts of the body.  Insurance typically does not pay for BBL/laser treatments and therefore the fee is an out-of-pocket cost. Recommend prophylactic valtrex treatment. Once scheduled for procedure, will send Rx in prior to patient's appointment.     Seborrheic Keratosis  What causes seborrheic keratoses? Seborrheic keratoses are harmless, common skin growths that first appear during adult life.  As time goes by, more growths appear.  Some people may develop a large number of them.  Seborrheic keratoses appear on both covered and uncovered body parts.  They are not caused by sunlight.  The tendency to develop seborrheic keratoses can be inherited.  They vary in color  from skin-colored to gray, brown, or even black.  They can be either smooth or have a rough, warty surface.   Seborrheic keratoses are superficial and look as if they were stuck on the skin.  Under the microscope this type of keratosis looks like layers upon layers of skin.  That is why at times the top layer may seem to fall off, but the rest of the growth remains and re-grows.    Treatment Seborrheic keratoses do not need to be treated, but can easily be removed in the office.  Seborrheic keratoses often cause symptoms when they rub on clothing or jewelry.  Lesions can be in the way of shaving.  If they become inflamed, they can cause itching, soreness, or burning.  Removal of a seborrheic keratosis can be accomplished by freezing, burning, or surgery. If any spot bleeds, scabs, or grows rapidly, please return to have it checked, as these can be an indication of a skin cancer.    Cryotherapy Aftercare  Wash gently with soap and water everyday.   Apply Vaseline and Band-Aid daily until healed.    Due to recent changes in healthcare laws, you may see results of your pathology and/or laboratory studies on MyChart before the doctors have had a chance to review them. We understand that in some cases there may be results that are confusing or concerning to you. Please understand that not all results are received at the same time and often the doctors may need to interpret multiple results in order to provide you with the best plan of care or course of treatment.  Therefore, we ask that you please give us  2 business days to thoroughly review all your results before contacting the office for clarification. Should we see a critical lab result, you will be contacted sooner.   If You Need Anything After Your Visit  If you have any questions or concerns for your doctor, please call our main line at [phone] and press option 4 to reach your doctor's medical assistant. If no one answers, please leave a  voicemail as directed and we will return your call as soon as possible. Messages left after 4 pm will be answered the following business day.   You may also send us  a message via MyChart. We typically respond to MyChart messages within 1-2 business days.  For prescription refills, please ask your pharmacy to contact our office. Our fax number is [phone].  If you have an urgent issue when the clinic is closed that cannot wait until the next business day, you can page your doctor at the number below.    Please note that while we do our best to be available for urgent issues outside of office hours, we are not available 24/7.   If you have an urgent issue and are unable to reach us , you may choose to seek medical care at your doctor's office, retail clinic, urgent care center, or emergency room.  If you have a medical emergency, please immediately call 911 or go to the emergency department.  Pager Numbers  - Dr. Hester: [phone]  - Dr. Jackquline: [phone]  - Dr. Claudene: [phone]   - Dr. Raymund: [phone]  In the event of inclement weather, please call our main line at [phone] for an update on the status of any delays or closures.  Dermatology Medication Tips: Please keep the boxes that topical medications come in in order to help keep track of the instructions about where and how to use these. Pharmacies typically print the medication instructions only on the boxes and not directly on the medication tubes.   If your medication is too expensive, please contact our office at [phone] option 4 or send us  a message through MyChart.   We are unable to tell what your co-pay for medications will be in advance as this is different depending on your insurance coverage. However, we may be able to find a substitute medication at lower cost or fill out paperwork to get insurance to cover a needed medication.   If a prior authorization is required to get your medication  covered by your insurance company, please allow us  1-2 business days to complete this process.  Drug prices often vary depending on where the prescription is filled and some pharmacies may offer cheaper prices.  The website www.goodrx.com contains coupons for medications through different pharmacies. The prices here do not account for what the cost may be with help from insurance (it may be cheaper with your insurance), but the website can give you the price if you did not use any insurance.  - You can print the associated coupon and take it with your prescription to the pharmacy.  - You may also stop by our office during regular business hours and pick up a GoodRx coupon card.  - If you need your prescription sent electronically to a different pharmacy, notify our office through [facility] or by phone at [phone] option 4.     Si Usted Necesita Algo Despus de Su Visita  Tambin puede enviarnos un mensaje a travs de Clinical Cytogeneticist. Por lo  general respondemos a los mensajes de MyChart en el transcurso de 1 a 2 das hbiles.  Para renovar recetas, por favor pida a su farmacia que se ponga en contacto con nuestra oficina. Randi lakes de fax es [place] [phone].  Si tiene un asunto urgente cuando la clnica est cerrada y que no puede esperar hasta el siguiente da hbil, puede llamar/localizar a su doctor(a) al nmero que aparece a continuacin.   Por favor, tenga en cuenta que aunque hacemos todo lo posible para estar disponibles para asuntos urgentes fuera del horario de [place], no estamos disponibles las 24 horas del da, los 7 [address]  [address] de la [place].   Si tiene un problema urgente y no puede comunicarse con nosotros, puede optar por buscar atencin mdica  en el consultorio de su doctor(a), en una clnica privada, en un centro de atencin urgente o en una sala de emergencias.  Si tiene engineer, drilling, por favor llame inmediatamente al 911 o vaya a la sala de  emergencias.  Nmeros de bper  - Dr. Hester: [phone]  - Dra. Jackquline: [phone]  - Dr. Claudene: [phone]  - Dra. Kitts: [phone]  En caso de inclemencias del [place], por favor llame a nuestra lnea principal al [phone] para una actualizacin sobre el estado de cualquier retraso o cierre.  Consejos para la medicacin en dermatologa: Por favor, guarde las cajas en las que vienen los medicamentos de uso tpico para ayudarle a seguir las instrucciones sobre dnde y cmo usarlos. Las farmacias generalmente imprimen las instrucciones del medicamento slo en las cajas y no directamente en los tubos del [place].   Si su medicamento es muy caro, por favor, pngase en contacto con landry rieger llamando al [phone] y presione la opcin 4 o envenos un mensaje a travs de Clinical Cytogeneticist.   No podemos decirle cul ser su copago por los medicamentos por adelantado ya que esto es diferente dependiendo de la cobertura de su seguro. Sin embargo, es posible que podamos encontrar un medicamento sustituto a audiological scientist un formulario para que el seguro cubra el medicamento que se considera necesario.   Si se requiere una autorizacin previa para que su compaa de seguros cubra su medicamento, por favor permtanos de 1 a 2 das hbiles para completar este proceso.  Los precios de los medicamentos varan con frecuencia dependiendo del environmental consultant de dnde se surte la receta y alguna farmacias pueden ofrecer precios ms baratos.  El sitio web www.goodrx.com tiene cupones para medicamentos de health and safety inspector. Los precios aqu no tienen en cuenta lo que podra costar con la ayuda del seguro (puede ser ms barato con su seguro), pero el sitio web puede darle el precio si no utiliz tourist information centre manager.  - Puede imprimir el cupn correspondiente y llevarlo con su receta a la farmacia.  - Tambin puede pasar por nuestra oficina durante el horario de atencin regular y education officer, museum una tarjeta  de cupones de GoodRx.  - Si necesita que su receta se enve electrnicamente a una farmacia diferente, informe a nuestra oficina a travs de MyChart de [place] o por telfono llamando al [phone] y presione la opcin 4.

## 2024-03-06 NOTE — Progress Notes (Signed)
 Follow-Up Visit   Subjective  Brittany Copeland is a 38 y.o. female who presents for the following: Patient is here today concerning a spot that was treated on back that did not go away, a dilated vessel at her right upper lip to discuss treatment, redness at face she would like to discuss treatment, and a spot she noticed that looks different on her left thigh.   The following portions of the chart were reviewed this encounter and updated as appropriate: medications, allergies, medical history  Review of Systems:  No other skin or systemic complaints except as noted in HPI or Assessment and Plan.  Objective  Well appearing patient in no apparent distress; mood and affect are within normal limits.  A focused examination was performed of the following areas: Left thigh, face, back   Relevant exam findings are noted in the Assessment and Plan.  Telangiectasia at right of midline upper lip at vermillion border     Rosacea at face    Left thigh - hemangioma   Left thigh hemagioma     right mid back x 1, left anterior neck x 1 (2) Erythematous stuck-on, waxy papule or plaque  Assessment & Plan  HEMANGIOMA Exam: Purple / red papule at left anterior thigh  See photo  Dermatoscopic exam consistent with benign hemangioma today Discussed benign nature. Recommend observation. Call for changes.  SEBORRHEIC KERATOSIS - Stuck-on, waxy, tan-brown papules and/or plaques  - Benign-appearing - Discussed benign etiology and prognosis. - Observe - Call for any changes  MELANOCYTIC NEVI Exam: Tan-brown and/or pink-flesh-colored symmetric macules and papules Treatment Plan: Benign appearing on exam today. Recommend observation. Call clinic for new or changing moles. Recommend daily use of broad spectrum spf 30+ sunscreen to sun-exposed areas.   Telangiectasia R of midline upper lip at vermillion border Benign, observe.   Discussed could take more than one treatment  Counseling for  BBL / IPL / Laser and Coordination of Care Discussed the treatment option of Broad Band Light (BBL) /Intense Pulsed Light (IPL)/ Laser for skin discoloration, including brown spots and redness.  Typically we recommend at least 1-3 treatment sessions about 5-8 weeks apart for best results.  Cannot have tanned skin when BBL performed, and regular use of sunscreen/photoprotection is advised after the procedure to help maintain results. The patient's condition may also require maintenance treatments in the future.  The fee for BBL / laser treatments is $350 per treatment session for the whole face.  A fee can be quoted for other parts of the body.  Insurance typically does not pay for BBL/laser treatments and therefore the fee is an out-of-pocket cost. Recommend prophylactic valtrex treatment. Once scheduled for procedure, will send Rx in prior to patient's appointment.   ROSACEA Exam Mid face erythema with telangiectasias +/- scattered inflammatory papules Chronic and persistent condition with duration or expected duration over one year. Condition is bothersome/symptomatic for patient. Currently flared. Rosacea is a chronic progressive skin condition usually affecting the face of adults, causing redness and/or acne bumps. It is treatable but not curable. It sometimes affects the eyes (ocular rosacea) as well. It may respond to topical and/or systemic medication and can flare with stress, sun exposure, alcohol, exercise, topical steroids (including hydrocortisone/cortisone 10) and some foods.  Daily application of broad spectrum spf 30+ sunscreen to face is recommended to reduce flares.  Patient denies grittiness of the eyes   Treatment Plan Discussed topical treatment Start [facility] metronidazole /ivermectin/azelaic acid twice daily as needed to affected  areas on the face. The patient was advised this is not covered by insurance since it is made by a compounding pharmacy. They will receive an email  to check out and the medication will be mailed to their home.   Discussed Counseling for BBL / IPL / Laser and Coordination of Care Discussed the treatment option of Broad Band Light (BBL) /Intense Pulsed Light (IPL)/ Laser for skin discoloration, including brown spots and redness.  Typically we recommend at least 1-3 treatment sessions about 5-8 weeks apart for best results.  Cannot have tanned skin when BBL performed, and regular use of sunscreen/photoprotection is advised after the procedure to help maintain results. The patient's condition may also require maintenance treatments in the future.  The fee for BBL / laser treatments is $350 per treatment session for the whole face.  A fee can be quoted for other parts of the body.  Insurance typically does not pay for BBL/laser treatments and therefore the fee is an out-of-pocket cost. Recommend prophylactic valtrex treatment. Once scheduled for procedure, will send Rx in prior to patient's appointment.   INFLAMED SEBORRHEIC KERATOSIS (2) right mid back x 1, left anterior neck x 1 (2) Symptomatic, irritating, patient would like treated. Destruction of lesion - right mid back x 1, left anterior neck x 1 (2) Complexity: simple   Destruction method: cryotherapy   Informed consent: discussed and consent obtained   Timeout:  patient name, date of birth, surgical site, and procedure verified Lesion destroyed using liquid nitrogen: Yes   Region frozen until ice ball extended beyond lesion: Yes   Outcome: patient tolerated procedure well with no complications   Post-procedure details: wound care instructions given     Return for 1 year follow up rosacea and spot check .  IEleanor Blush, CMA, am acting as scribe for Alm Rhyme, MD.   Documentation: I have reviewed the above documentation for accuracy and completeness, and I agree with the above.  Alm Rhyme, MD

## 2024-03-08 ENCOUNTER — Other Ambulatory Visit (HOSPITAL_COMMUNITY): Payer: Self-pay

## 2024-03-25 ENCOUNTER — Other Ambulatory Visit: Payer: Self-pay | Admitting: Family Medicine

## 2024-03-25 DIAGNOSIS — F4323 Adjustment disorder with mixed anxiety and depressed mood: Secondary | ICD-10-CM

## 2024-03-26 ENCOUNTER — Other Ambulatory Visit: Payer: Self-pay

## 2024-03-26 ENCOUNTER — Other Ambulatory Visit (HOSPITAL_COMMUNITY): Payer: Self-pay

## 2024-03-27 ENCOUNTER — Other Ambulatory Visit (HOSPITAL_COMMUNITY): Payer: Self-pay

## 2024-03-27 ENCOUNTER — Telehealth: Admitting: Physician Assistant

## 2024-03-27 ENCOUNTER — Other Ambulatory Visit: Payer: Self-pay

## 2024-03-27 DIAGNOSIS — R6889 Other general symptoms and signs: Secondary | ICD-10-CM | POA: Diagnosis not present

## 2024-03-27 DIAGNOSIS — R051 Acute cough: Secondary | ICD-10-CM

## 2024-03-27 DIAGNOSIS — R52 Pain, unspecified: Secondary | ICD-10-CM

## 2024-03-27 MED ORDER — BENZONATATE 100 MG PO CAPS: 100.0000 mg | ORAL_CAPSULE | Freq: Three times a day (TID) | ORAL | 0 refills | Status: AC | PRN

## 2024-03-27 MED ORDER — ONDANSETRON 4 MG PO TBDP: 4.0000 mg | ORAL_TABLET | Freq: Three times a day (TID) | ORAL | 0 refills | Status: AC | PRN

## 2024-03-27 MED ORDER — BUPROPION HCL ER (XL) 300 MG PO TB24
300.0000 mg | ORAL_TABLET | Freq: Every day | ORAL | 1 refills | Status: AC
  Filled 2024-03-27: qty 90, 90d supply, fill #0

## 2024-03-27 MED ORDER — NAPROXEN 500 MG PO TABS: 500.0000 mg | ORAL_TABLET | Freq: Two times a day (BID) | ORAL | 0 refills | Status: AC

## 2024-03-27 NOTE — Progress Notes (Signed)
 E-Visit for COVID/Influenza Screening  Your current symptoms could be consistent with COVID-19 or Influenza. Please complete a COVID + Flu Test at home (available at all retail pharmacies for testing for both), or check with your local pharmacy to see if they provide testing.   If you have tested positive for either COVID or Influenza, it means that you were infected with that particular virus and could give the virus to others.  Most people with these infections have a mild illness and can recover at home without medical care. Do not leave your home, except to get medical care.  DO not visit public areas and do not go to places where you are unable to wear a mask. It is important for you to stay home to take care of yourself and to help protect other people in your home and community.   Isolation Instructions:  You are to isolate at home for now until you have taken your home COVID/Flu test and notified our team of your results, at which time further isolation instructions will be given.  If you must be around other household members who do not have symptoms, you need to make sure that both you and the family members are masking consistently with a high-quality mask, even while in the home.  If you note any worsening of symptoms despite treatment, please seek an IN-PERSON evaluation ASAP. If you note any significant shortness of breath or any chest pain, please seek immediate ER evaluation. Please do not delay care!  Go to the nearest hospital ED for assessment if fever/cough/breathlessness are severe or illness seems like a threat to life.    The following symptoms may appear 2-14 days after exposure: Fever Cough Shortness of breath or difficulty breathing Chills Repeated shaking with chills Muscle pain Headache Sore throat New loss of taste or smell Fatigue Congestion or runny nose Nausea or vomiting Diarrhea   For symptoms,  I have prescribed Tessalon  Perles 100 mg. You may take  1-2 capsules every 8 hours as needed for cough, I have prescribed an anti-inflammatory - Naprosyn  500 mg. Take twice daily as needed for fever or body aches for 2 weeks, and I have prescribed Zofran  4 mg tablets 1 every 6 hours if needed for nausea You may also take acetaminophen  (Tylenol ) as needed for fever.   Reduce your risk of any infection by using the same precautions used for avoiding the common cold or flu:  Wash your hands often with soap and warm water for at least 20 seconds.  If soap and water are not readily available, use an alcohol-based hand sanitizer with at least 60% alcohol.  If coughing or sneezing, cover your mouth and nose by coughing or sneezing into the elbow areas of your shirt or coat, into a tissue or into your sleeve (not your hands). Avoid shaking hands with others and consider head nods or verbal greetings only. Avoid touching your eyes, nose, or mouth with unwashed hands.  Avoid close contact with people who are sick. Avoid places or events with large numbers of people in one location, like concerts or sporting events. Carefully consider travel plans you have or are making. If you are planning any travel outside or inside the [place] , visit the CDC's Travelers' Health webpage for the latest health notices. If you have some symptoms but not all symptoms, continue to monitor at home and seek medical attention if your symptoms worsen. If you are having a medical emergency, call 911.  HOME CARE  Only take medications as instructed by your medical team. Drink plenty of fluids and get plenty of rest. A steam or ultrasonic humidifier can help if you have congestion.   GET HELP RIGHT AWAY IF YOU HAVE EMERGENCY WARNING SIGNS** FOR COVID-19. If you or someone is showing any of these signs seek emergency medical care immediately. Call 911 or proceed to your closest emergency facility if: You develop worsening high fever. Trouble breathing. Bluish lips or face. Persistent pain  or pressure in the chest. New confusion. Inability to wake or stay awake. You cough up blood. Your symptoms become more severe.  **This list is not all possible symptoms. Contact your medical provider for any symptoms that are sever or concerning to you.   MAKE SURE YOU  Understand these instructions. Will watch your condition. Will get help right away if you are not doing well or get worse.  Your e-visit answers were reviewed by a board certified advanced clinical practitioner to complete your personal care plan.  Depending on the condition, your plan could have included both over the counter or prescription medications.  If there is a problem, please reply once you have received a response from your provider.  Your safety is important to us .  If you have drug allergies check your prescription carefully.    You can use MyChart to ask questions about today's visit, request a non-urgent call back, or ask for a work or school excuse for 24 hours related to this e-Visit. If it has been greater than 24 hours you will need to follow up with your provider, or enter a new e-Visit to address those concerns. You will get an e-mail in the next two days asking about your experience.  I hope that your e-visit has been valuable and will speed your recovery. Thank you for using e-visits.   I have spent 5 minutes in review of e-visit questionnaire, review and updating patient chart, medical decision making and response to patient.   Delon CHRISTELLA Dickinson, PA-C

## 2024-03-28 ENCOUNTER — Other Ambulatory Visit (HOSPITAL_COMMUNITY): Payer: Self-pay

## 2024-03-28 ENCOUNTER — Encounter (HOSPITAL_COMMUNITY): Payer: Self-pay

## 2024-04-02 ENCOUNTER — Other Ambulatory Visit: Payer: Self-pay

## 2024-04-02 ENCOUNTER — Other Ambulatory Visit (HOSPITAL_COMMUNITY): Payer: Self-pay

## 2024-04-03 ENCOUNTER — Other Ambulatory Visit: Payer: Self-pay

## 2024-04-11 ENCOUNTER — Encounter: Payer: Self-pay | Admitting: Family Medicine

## 2024-04-11 ENCOUNTER — Other Ambulatory Visit (HOSPITAL_COMMUNITY): Payer: Self-pay

## 2024-04-11 DIAGNOSIS — J4520 Mild intermittent asthma, uncomplicated: Secondary | ICD-10-CM

## 2024-04-11 MED ORDER — ADVAIR DISKUS 250-50 MCG/ACT IN AEPB: 1.0000 | INHALATION_SPRAY | Freq: Two times a day (BID) | RESPIRATORY_TRACT | 3 refills | Status: AC | PRN

## 2024-04-12 ENCOUNTER — Other Ambulatory Visit (HOSPITAL_COMMUNITY): Payer: Self-pay

## 2024-04-24 ENCOUNTER — Ambulatory Visit: Payer: Self-pay | Admitting: Family Medicine

## 2024-04-24 ENCOUNTER — Ambulatory Visit: Admitting: Family Medicine

## 2024-04-24 ENCOUNTER — Encounter: Payer: Self-pay | Admitting: Family Medicine

## 2024-04-24 ENCOUNTER — Ambulatory Visit (INDEPENDENT_AMBULATORY_CARE_PROVIDER_SITE_OTHER)
Admission: RE | Admit: 2024-04-24 | Discharge: 2024-04-24 | Disposition: A | Source: Ambulatory Visit | Attending: Family Medicine | Admitting: Family Medicine

## 2024-04-24 VITALS — BP 128/80 | HR 85 | Temp 98.2°F | Ht 67.25 in | Wt 190.0 lb

## 2024-04-24 DIAGNOSIS — M2550 Pain in unspecified joint: Secondary | ICD-10-CM

## 2024-04-24 DIAGNOSIS — R5382 Chronic fatigue, unspecified: Secondary | ICD-10-CM | POA: Diagnosis not present

## 2024-04-24 DIAGNOSIS — K900 Celiac disease: Secondary | ICD-10-CM

## 2024-04-24 DIAGNOSIS — M79642 Pain in left hand: Secondary | ICD-10-CM

## 2024-04-24 DIAGNOSIS — R052 Subacute cough: Secondary | ICD-10-CM | POA: Diagnosis not present

## 2024-04-24 DIAGNOSIS — R7689 Other specified abnormal immunological findings in serum: Secondary | ICD-10-CM

## 2024-04-24 DIAGNOSIS — R7401 Elevation of levels of liver transaminase levels: Secondary | ICD-10-CM

## 2024-04-24 DIAGNOSIS — R059 Cough, unspecified: Secondary | ICD-10-CM | POA: Insufficient documentation

## 2024-04-24 DIAGNOSIS — R5383 Other fatigue: Secondary | ICD-10-CM | POA: Insufficient documentation

## 2024-04-24 MED ORDER — PROMETHAZINE-DM 6.25-15 MG/5ML PO SYRP: 5.0000 mL | ORAL_SOLUTION | Freq: Every evening | ORAL | 0 refills | Status: AC | PRN

## 2024-04-24 MED ORDER — PREDNISONE 20 MG PO TABS: 20.0000 mg | ORAL_TABLET | Freq: Every day | ORAL | 0 refills | Status: AC

## 2024-04-24 NOTE — Assessment & Plan Note (Signed)
 Left 3rd MCP Painful Mild intermittent swelling Xray normal  Lab (for inflam arthritis) pending   Tendonitis in the differential

## 2024-04-24 NOTE — Assessment & Plan Note (Signed)
 Post viral s/p covid about a month ago  Also fatigue Reassuring exam Cxr ordered : clear /reassuring  Prescription prometh  dm and prednisone  20 mg to help stop cough cycle

## 2024-04-24 NOTE — Assessment & Plan Note (Signed)
 S/p covid about a month ago  Some cough and joint pain Lab today   Discussed possible mild post covid syndrome

## 2024-04-24 NOTE — Progress Notes (Signed)
 "  Subjective:    Patient ID: Brittany Copeland, female    DOB: Aug 27, 1985, 39 y.o.   MRN: [id number]  HPI  Wt Readings from Last 3 Encounters:  04/24/24 190 lb (86.2 kg)  02/08/24 183 lb (83 kg)  11/22/23 181 lb (82.1 kg)   29.54 kg/m  Vitals:   04/24/24 1458 04/24/24 1523  BP: (!) 144/86 128/80  Pulse: 85   Temp: 98.2 F (36.8 C)   SpO2: 98%     Pt presents for c/o   Joint pain  Hand pain Cough   She and her daughter had covid over holidays (not severe)  Then really bad coughing attacks  Rescue inhaler -advair  250-50 -at least once per day  Had virtual visit 12/23   Was prescription Benzonatate  Naproxen  Ondansetron    Cough is deep  Dry  Phelgm-clear and just a bit if any  Some wheezing (last week)  Not getting better but not getting worse   Fatigued also   No fever since she had covid  Throat/ears are ok  Occational a little hoarse    Then started having more pain in joints   Primarily left 3rd MCP   (little in wrist)  Hurts to grip  Feels tight Looks swollen occasionally  Happening every day   Feet -more top of foot than toes   Chronic knee problems - no better or worse     Lab Results  Component Value Date   ANA POSITIVE (A) 03/23/2023   RF <10 01/19/2023   Lab Results  Component Value Date   WBC 10.0 11/22/2023   HGB 13.1 11/22/2023   HCT 38.7 11/22/2023   MCV 88.4 11/22/2023   PLT 368.0 11/22/2023    Imaging DG Chest 2 View Result Date: 04/24/2024 CLINICAL DATA:  Cough. EXAM: CHEST - 2 VIEW COMPARISON:  None Available. FINDINGS: No focal consolidation, pleural effusion or pneumothorax. The cardiac silhouette is within normal limits. No acute osseous pathology. IMPRESSION: No active cardiopulmonary disease. Electronically Signed   By: Vanetta Chou M.D.   On: 04/24/2024 15:51   DG Hand Complete Left Result Date: 04/24/2024 CLINICAL DATA:  Left third metacarpophalangeal joint pain and swelling EXAM: LEFT HAND - COMPLETE 3+  VIEW COMPARISON:  None Available. FINDINGS: There is no evidence of fracture or dislocation. There is no evidence of arthropathy or other focal bone abnormality. Soft tissues are unremarkable. IMPRESSION: Negative. Electronically Signed   By: Lynwood Landy Raddle M.D.   On: 04/24/2024 15:50      Patient Active Problem List   Diagnosis Date Noted   Cough 04/24/2024   Left hand pain 04/24/2024   Fatigue 04/24/2024   Asthma, mild intermittent 11/22/2023   Skin tag 11/22/2023   Elevated antinuclear antibody (ANA) level 03/29/2023   Elevated transaminase level 01/23/2023   Nocturnal leg cramps 01/19/2023   Joint pain 01/19/2023   Weight gain 01/19/2023   Muscle pain 01/19/2023   Family history of early menopause 01/19/2023   Headache, chronic daily 01/19/2023   Grief reaction 01/19/2023   Defecation urgency 04/21/2022   Adjustment disorder with mixed anxiety and depressed mood 11/18/2021   Reactive depression 09/16/2021   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   Right knee pain 09/11/2013   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   Abnormal cervical Pap smear with positive HPV DNA test 04/25/2013   Celiac disease 04/19/2013   Routine general medical examination at a health care facility 04/18/2013   Encounter for routine gynecological examination 04/18/2013   History of  HPV infection 07/20/2006   Migraine headache 07/20/2006   Past Medical History:  Diagnosis Date   Allergy    Anemia    Anxiety    Arthritis    Asthma    Celiac disease    Chronic headaches    Depression    GERD (gastroesophageal reflux disease)    Headache    Inflammatory bowel disease    Vaginal Pap smear, abnormal    Past Surgical History:  Procedure Laterality Date   ADENOIDECTOMY     Social History[1] Family History  Problem Relation Age of Onset   Seizures Mother    Heart disease Father    Hypertension Father    ADD / ADHD Father    Early death Father    Diabetes Maternal Grandmother    Thyroid   disease Maternal Grandmother    Arthritis Maternal Grandmother    Hypothyroidism Maternal Grandmother    Cancer Paternal Grandfather    Heart disease Paternal Grandfather    Heart disease Paternal Uncle    Allergies[2] Medications Ordered Prior to Encounter[3]  Review of Systems  Constitutional:  Positive for fatigue. Negative for activity change, appetite change, fever and unexpected weight change.  HENT:  Negative for congestion, ear pain, rhinorrhea, sinus pressure and sore throat.   Eyes:  Negative for pain, redness and visual disturbance.  Respiratory:  Positive for cough. Negative for shortness of breath and wheezing.   Cardiovascular:  Negative for chest pain and palpitations.  Gastrointestinal:  Negative for abdominal pain, blood in stool, constipation and diarrhea.  Endocrine: Negative for polydipsia and polyuria.  Genitourinary:  Negative for dysuria, frequency and urgency.  Musculoskeletal:  Positive for arthralgias. Negative for back pain and myalgias.  Skin:  Negative for pallor and rash.  Allergic/Immunologic: Negative for environmental allergies.  Neurological:  Negative for dizziness, syncope and headaches.  Hematological:  Negative for adenopathy. Does not bruise/bleed easily.  Psychiatric/Behavioral:  Negative for decreased concentration and dysphoric mood. The patient is not nervous/anxious.        Objective:   Physical Exam Constitutional:      General: She is not in acute distress.    Appearance: Normal appearance. She is well-developed. She is not ill-appearing or diaphoretic.     Comments: Overweight   HENT:     Head: Normocephalic and atraumatic.     Right Ear: Tympanic membrane and ear canal normal.     Left Ear: Tympanic membrane and ear canal normal.     Nose: Nose normal.     Mouth/Throat:     Mouth: Mucous membranes are moist.     Pharynx: Oropharynx is clear. No posterior oropharyngeal erythema.  Eyes:     General:        Right eye: No  discharge.        Left eye: No discharge.     Conjunctiva/sclera: Conjunctivae normal.     Pupils: Pupils are equal, round, and reactive to light.  Neck:     Thyroid : No thyromegaly.     Vascular: No carotid bruit or JVD.  Cardiovascular:     Rate and Rhythm: Normal rate and regular rhythm.     Heart sounds: Normal heart sounds.     No gallop.  Pulmonary:     Effort: Pulmonary effort is normal. No respiratory distress.     Breath sounds: Normal breath sounds. No stridor. No wheezing, rhonchi or rales.     Comments: Good air exch Abdominal:     General: There is no  distension or abdominal bruit.     Palpations: Abdomen is soft.  Musculoskeletal:     Cervical back: Normal range of motion and neck supple. No tenderness.     Right lower leg: No edema.     Left lower leg: No edema.     Comments: Left 3rd MCP joint  Tender to touch  No crepitius  No triggering  Normal rom with discomfort   No wrist or other hand tenderness No deformities in feet or hands  Lymphadenopathy:     Cervical: No cervical adenopathy.  Skin:    General: Skin is warm and dry.     Coloration: Skin is not pale.     Findings: No bruising, erythema or rash.  Neurological:     Mental Status: She is alert.     Coordination: Coordination normal.     Deep Tendon Reflexes: Reflexes are normal and symmetric. Reflexes normal.  Psychiatric:        Mood and Affect: Mood normal.           Assessment & Plan:   Problem List Items Addressed This Visit       Other   Left hand pain   Left 3rd MCP Painful Mild intermittent swelling Xray normal  Lab (for inflam arthritis) pending   Tendonitis in the differential       Relevant Orders   DG Hand Complete Left (Completed)   Joint pain   Left hand (3rd mcp) Wrists Knees   Labs today for auto immune dz (pt does have celiac)  Positive ana in past  Xray left hand -normal/ reassuring       Relevant Orders   Rheumatoid factor   ANA   Sedimentation  Rate   Fatigue   S/p covid about a month ago  Some cough and joint pain Lab today   Discussed possible mild post covid syndrome       Relevant Orders   CBC with Differential   Comprehensive metabolic panel with GFR   Cough - Primary   Post viral s/p covid about a month ago  Also fatigue Reassuring exam Cxr ordered : clear /reassuring  Prescription prometh  dm and prednisone  20 mg to help stop cough cycle       Relevant Orders   DG Chest 2 View (Completed)      [1]  Social History Tobacco Use   Smoking status: Never   Smokeless tobacco: Never  Substance Use Topics   Alcohol use: No   Drug use: No  [2]  Allergies Allergen Reactions   Pineapple Anaphylaxis   Augmentin  [Amoxicillin -Pot Clavulanate] Nausea And Vomiting and Other (See Comments)    Has patient had a PCN reaction causing immediate rash, facial/tongue/throat swelling, SOB or lightheadedness with hypotension: No Has patient had a PCN reaction causing severe rash involving mucus membranes or skin necrosis: No Has patient had a PCN reaction that required hospitalization No Has patient had a PCN reaction occurring within the last 10 years: Yes If all of the above answers are NO, then may proceed with Cephalosporin use.    Flonase  [Fluticasone  Propionate]     Made pt feel flushed and face turned red   Gluten Meal Other (See Comments)    Pt has celiac disease.    [3]  Current Outpatient Medications on File Prior to Visit  Medication Sig Dispense Refill   ADVAIR  DISKUS 250-50 MCG/ACT AEPB Inhale 1 puff into the lungs 2 (two) times daily as needed. 60 each 3  b complex vitamins capsule Take 1 capsule by mouth daily.     benzonatate  (TESSALON ) 100 MG capsule Take 1-2 capsules (100-200 mg total) by mouth 3 (three) times daily as needed. 30 capsule 0   Biotin w/ Vitamins C & E (HAIR/SKIN/NAILS PO) Take by mouth.     buPROPion  (WELLBUTRIN  XL) 300 MG 24 hr tablet Take 1 tablet (300 mg total) by mouth daily. 90  tablet 1   busPIRone  (BUSPAR ) 15 MG tablet Take 1 tablet (15 mg total) by mouth 2 (two) times daily. 180 tablet 1   Cholecalciferol (VITAMIN D3) 50 MCG (2000 UT) CAPS Take 1 capsule by mouth 3 (three) times a week.     levonorgestrel  (MIRENA , 52 MG,) 20 MCG/DAY IUD 1 each by Intrauterine route once.     Misc Natural Products (FIBER 7 PO) Take 1 tablet by mouth daily.     Multiple Vitamin (MULTIVITAMIN) capsule Take 1 capsule by mouth daily.     naproxen  (NAPROSYN ) 500 MG tablet Take 1 tablet (500 mg total) by mouth 2 (two) times daily with a meal. 30 tablet 0   ondansetron  (ZOFRAN -ODT) 4 MG disintegrating tablet Take 1 tablet (4 mg total) by mouth every 8 (eight) hours as needed. 20 tablet 0   Probiotic Product (PROBIOTIC DAILY PO) Take 2 tablets by mouth 2 (two) times a week.     No current facility-administered medications on file prior to visit.   "

## 2024-04-24 NOTE — Patient Instructions (Addendum)
 Take the prednisone  as directed 20 mg daily 5 days  Try the prometh  dm for cough to stop the cycle   Chest xray today  Hand xray today   Lab today

## 2024-04-24 NOTE — Assessment & Plan Note (Signed)
 Left hand (3rd mcp) Wrists Knees   Labs today for auto immune dz (pt does have celiac)  Positive ana in past  Xray left hand -normal/ reassuring

## 2024-04-25 LAB — CBC WITH DIFFERENTIAL/PLATELET
Basophils Absolute: 0.1 K/uL (ref 0.0–0.1)
Basophils Relative: 1 % (ref 0.0–3.0)
Eosinophils Absolute: 0.1 K/uL (ref 0.0–0.7)
Eosinophils Relative: 0.9 % (ref 0.0–5.0)
HCT: 38.3 % (ref 36.0–46.0)
Hemoglobin: 13.2 g/dL (ref 12.0–15.0)
Lymphocytes Relative: 31.4 % (ref 12.0–46.0)
Lymphs Abs: 3.4 K/uL (ref 0.7–4.0)
MCHC: 34.3 g/dL (ref 30.0–36.0)
MCV: 88 fl (ref 78.0–100.0)
Monocytes Absolute: 0.7 K/uL (ref 0.1–1.0)
Monocytes Relative: 6.1 % (ref 3.0–12.0)
Neutro Abs: 6.5 K/uL (ref 1.4–7.7)
Neutrophils Relative %: 60.6 % (ref 43.0–77.0)
Platelets: 384 K/uL (ref 150.0–400.0)
RBC: 4.36 Mil/uL (ref 3.87–5.11)
RDW: 12.7 % (ref 11.5–15.5)
WBC: 10.8 K/uL — ABNORMAL HIGH (ref 4.0–10.5)

## 2024-04-25 LAB — COMPREHENSIVE METABOLIC PANEL WITH GFR
ALT: 66 U/L — ABNORMAL HIGH (ref 3–35)
AST: 32 U/L (ref 5–37)
Albumin: 4.5 g/dL (ref 3.5–5.2)
Alkaline Phosphatase: 42 U/L (ref 39–117)
BUN: 11 mg/dL (ref 6–23)
CO2: 30 meq/L (ref 19–32)
Calcium: 9.3 mg/dL (ref 8.4–10.5)
Chloride: 100 meq/L (ref 96–112)
Creatinine, Ser: 0.78 mg/dL (ref 0.40–1.20)
GFR: 94.96 mL/min
Glucose, Bld: 89 mg/dL (ref 70–99)
Potassium: 3.7 meq/L (ref 3.5–5.1)
Sodium: 136 meq/L (ref 135–145)
Total Bilirubin: 0.4 mg/dL (ref 0.2–1.2)
Total Protein: 7.5 g/dL (ref 6.0–8.3)

## 2024-04-25 LAB — SEDIMENTATION RATE: Sed Rate: 6 mm/h (ref 0–20)

## 2024-04-26 LAB — ANA: Anti Nuclear Antibody (ANA): POSITIVE — AB

## 2024-04-26 LAB — ANTI-NUCLEAR AB-TITER (ANA TITER): ANA Titer 1: 1:40 {titer} — ABNORMAL HIGH

## 2024-04-26 LAB — RHEUMATOID FACTOR: Rheumatoid fact SerPl-aCnc: <10 [IU]/mL

## 2024-05-21 ENCOUNTER — Other Ambulatory Visit

## 2024-07-24 ENCOUNTER — Ambulatory Visit: Admitting: Internal Medicine

## 2024-07-25 ENCOUNTER — Ambulatory Visit: Admitting: Dermatology

## 2025-03-06 ENCOUNTER — Ambulatory Visit: Admitting: Dermatology
